# Patient Record
Sex: Female | Born: 1940 | Race: Black or African American | Hispanic: No | Smoking: Never smoker
Health system: Southern US, Community
[De-identification: ages and names within clinical notes are randomized; demographics above are authoritative.]

## PROBLEM LIST (undated history)

## (undated) DIAGNOSIS — C50919 Malignant neoplasm of unspecified site of unspecified female breast: Secondary | ICD-10-CM

## (undated) DIAGNOSIS — I4892 Unspecified atrial flutter: Secondary | ICD-10-CM

## (undated) DIAGNOSIS — I10 Essential (primary) hypertension: Secondary | ICD-10-CM

## (undated) DIAGNOSIS — F329 Major depressive disorder, single episode, unspecified: Secondary | ICD-10-CM

## (undated) DIAGNOSIS — Z950 Presence of cardiac pacemaker: Secondary | ICD-10-CM

## (undated) DIAGNOSIS — F32A Depression, unspecified: Secondary | ICD-10-CM

## (undated) DIAGNOSIS — I4891 Unspecified atrial fibrillation: Secondary | ICD-10-CM

## (undated) DIAGNOSIS — E119 Type 2 diabetes mellitus without complications: Secondary | ICD-10-CM

## (undated) DIAGNOSIS — N3281 Overactive bladder: Secondary | ICD-10-CM

## (undated) DIAGNOSIS — I509 Heart failure, unspecified: Secondary | ICD-10-CM

## (undated) DIAGNOSIS — F419 Anxiety disorder, unspecified: Secondary | ICD-10-CM

## (undated) DIAGNOSIS — N19 Unspecified kidney failure: Secondary | ICD-10-CM

## (undated) DIAGNOSIS — C50912 Malignant neoplasm of unspecified site of left female breast: Secondary | ICD-10-CM

## (undated) HISTORY — DX: Overactive bladder: N32.81

## (undated) HISTORY — DX: Malignant neoplasm of unspecified site of left female breast: C50.912

## (undated) HISTORY — DX: Unspecified atrial flutter: I48.92

## (undated) HISTORY — PX: DILATION AND CURETTAGE OF UTERUS: SHX78

## (undated) HISTORY — DX: Unspecified kidney failure: N19

## (undated) HISTORY — DX: Unspecified atrial fibrillation: I48.91

## (undated) HISTORY — DX: Major depressive disorder, single episode, unspecified: F32.9

## (undated) HISTORY — DX: Malignant neoplasm of unspecified site of unspecified female breast: C50.919

## (undated) HISTORY — DX: Heart failure, unspecified: I50.9

## (undated) HISTORY — DX: Type 2 diabetes mellitus without complications: E11.9

## (undated) HISTORY — DX: Essential (primary) hypertension: I10

## (undated) HISTORY — PX: LAPAROSCOPIC OOPHERECTOMY: SHX6507

## (undated) HISTORY — DX: Depression, unspecified: F32.A

## (undated) HISTORY — DX: Presence of cardiac pacemaker: Z95.0

## (undated) HISTORY — DX: Anxiety disorder, unspecified: F41.9

## (undated) HISTORY — PX: HERNIA REPAIR: SHX51

---

## 1997-03-25 HISTORY — PX: MASTECTOMY: SHX3

## 2004-03-30 ENCOUNTER — Ambulatory Visit: Payer: Self-pay | Admitting: Internal Medicine

## 2004-04-20 ENCOUNTER — Ambulatory Visit: Payer: Self-pay | Admitting: Oncology

## 2004-09-18 ENCOUNTER — Other Ambulatory Visit: Payer: Self-pay

## 2004-09-18 ENCOUNTER — Emergency Department: Payer: Self-pay | Admitting: General Practice

## 2004-10-02 ENCOUNTER — Emergency Department: Payer: Self-pay | Admitting: General Practice

## 2004-10-19 ENCOUNTER — Ambulatory Visit: Payer: Self-pay | Admitting: Oncology

## 2004-10-23 ENCOUNTER — Ambulatory Visit: Payer: Self-pay | Admitting: Oncology

## 2004-11-23 ENCOUNTER — Ambulatory Visit: Payer: Self-pay | Admitting: Oncology

## 2005-10-21 ENCOUNTER — Ambulatory Visit: Payer: Self-pay | Admitting: Oncology

## 2005-10-23 ENCOUNTER — Ambulatory Visit: Payer: Self-pay | Admitting: Oncology

## 2005-11-23 ENCOUNTER — Ambulatory Visit: Payer: Self-pay | Admitting: Oncology

## 2005-12-18 ENCOUNTER — Other Ambulatory Visit: Payer: Self-pay

## 2005-12-18 ENCOUNTER — Ambulatory Visit: Payer: Self-pay | Admitting: Surgery

## 2005-12-23 ENCOUNTER — Ambulatory Visit: Payer: Self-pay | Admitting: Surgery

## 2006-01-03 ENCOUNTER — Ambulatory Visit: Payer: Self-pay | Admitting: Oncology

## 2006-01-10 ENCOUNTER — Ambulatory Visit: Payer: Self-pay | Admitting: Oncology

## 2006-01-23 ENCOUNTER — Ambulatory Visit: Payer: Self-pay | Admitting: Oncology

## 2006-02-22 ENCOUNTER — Ambulatory Visit: Payer: Self-pay | Admitting: Oncology

## 2006-03-25 ENCOUNTER — Ambulatory Visit: Payer: Self-pay | Admitting: Oncology

## 2006-04-25 ENCOUNTER — Ambulatory Visit: Payer: Self-pay | Admitting: Oncology

## 2006-05-28 ENCOUNTER — Ambulatory Visit: Payer: Self-pay | Admitting: Oncology

## 2006-06-11 ENCOUNTER — Ambulatory Visit: Payer: Self-pay | Admitting: Oncology

## 2006-06-19 ENCOUNTER — Other Ambulatory Visit: Payer: Self-pay

## 2006-06-19 ENCOUNTER — Ambulatory Visit: Payer: Self-pay | Admitting: Surgery

## 2006-06-24 HISTORY — PX: MASTECTOMY: SHX3

## 2006-06-30 ENCOUNTER — Inpatient Hospital Stay: Payer: Self-pay | Admitting: Surgery

## 2006-07-02 ENCOUNTER — Other Ambulatory Visit: Payer: Self-pay

## 2006-07-02 ENCOUNTER — Inpatient Hospital Stay: Payer: Self-pay | Admitting: Surgery

## 2006-07-14 ENCOUNTER — Ambulatory Visit: Payer: Self-pay | Admitting: Oncology

## 2006-07-15 ENCOUNTER — Ambulatory Visit: Payer: Self-pay | Admitting: Oncology

## 2006-07-24 ENCOUNTER — Ambulatory Visit: Payer: Self-pay | Admitting: Oncology

## 2006-08-24 ENCOUNTER — Ambulatory Visit: Payer: Self-pay | Admitting: Oncology

## 2006-08-27 ENCOUNTER — Ambulatory Visit: Payer: Self-pay | Admitting: Oncology

## 2006-09-23 ENCOUNTER — Ambulatory Visit: Payer: Self-pay | Admitting: Oncology

## 2006-11-08 ENCOUNTER — Inpatient Hospital Stay: Payer: Self-pay | Admitting: Internal Medicine

## 2006-11-08 ENCOUNTER — Other Ambulatory Visit: Payer: Self-pay

## 2006-11-24 ENCOUNTER — Ambulatory Visit: Payer: Self-pay | Admitting: Oncology

## 2006-11-26 ENCOUNTER — Ambulatory Visit: Payer: Self-pay | Admitting: Oncology

## 2006-12-01 ENCOUNTER — Ambulatory Visit: Payer: Self-pay | Admitting: Family

## 2006-12-08 ENCOUNTER — Ambulatory Visit: Payer: Self-pay | Admitting: Family

## 2006-12-15 DIAGNOSIS — I482 Chronic atrial fibrillation, unspecified: Secondary | ICD-10-CM | POA: Insufficient documentation

## 2006-12-16 ENCOUNTER — Other Ambulatory Visit: Payer: Self-pay

## 2006-12-16 ENCOUNTER — Inpatient Hospital Stay: Payer: Self-pay | Admitting: Internal Medicine

## 2006-12-16 ENCOUNTER — Ambulatory Visit: Payer: Self-pay | Admitting: Internal Medicine

## 2006-12-19 ENCOUNTER — Other Ambulatory Visit: Payer: Self-pay

## 2006-12-19 ENCOUNTER — Inpatient Hospital Stay: Payer: Self-pay | Admitting: Internal Medicine

## 2006-12-24 ENCOUNTER — Ambulatory Visit: Payer: Self-pay | Admitting: Oncology

## 2006-12-24 ENCOUNTER — Other Ambulatory Visit: Payer: Self-pay

## 2007-01-13 ENCOUNTER — Other Ambulatory Visit: Payer: Self-pay

## 2007-01-13 ENCOUNTER — Inpatient Hospital Stay: Payer: Self-pay | Admitting: Internal Medicine

## 2007-02-23 ENCOUNTER — Ambulatory Visit: Payer: Self-pay | Admitting: Oncology

## 2007-03-25 ENCOUNTER — Ambulatory Visit: Payer: Self-pay | Admitting: Oncology

## 2007-03-26 ENCOUNTER — Ambulatory Visit: Payer: Self-pay | Admitting: Oncology

## 2007-05-09 ENCOUNTER — Inpatient Hospital Stay: Payer: Self-pay | Admitting: Internal Medicine

## 2007-05-09 ENCOUNTER — Other Ambulatory Visit: Payer: Self-pay

## 2007-05-24 ENCOUNTER — Ambulatory Visit: Payer: Self-pay | Admitting: Oncology

## 2007-06-24 ENCOUNTER — Ambulatory Visit: Payer: Self-pay | Admitting: Oncology

## 2007-06-24 DIAGNOSIS — Z950 Presence of cardiac pacemaker: Secondary | ICD-10-CM

## 2007-06-24 HISTORY — DX: Presence of cardiac pacemaker: Z95.0

## 2007-09-09 ENCOUNTER — Ambulatory Visit: Payer: Self-pay | Admitting: Oncology

## 2007-09-23 ENCOUNTER — Ambulatory Visit: Payer: Self-pay | Admitting: Oncology

## 2007-10-24 ENCOUNTER — Ambulatory Visit: Payer: Self-pay | Admitting: Oncology

## 2007-11-24 ENCOUNTER — Ambulatory Visit: Payer: Self-pay | Admitting: Oncology

## 2007-12-09 ENCOUNTER — Ambulatory Visit: Payer: Self-pay | Admitting: Oncology

## 2007-12-24 ENCOUNTER — Ambulatory Visit: Payer: Self-pay | Admitting: Oncology

## 2008-03-25 ENCOUNTER — Ambulatory Visit: Payer: Self-pay | Admitting: Oncology

## 2008-04-11 ENCOUNTER — Ambulatory Visit: Payer: Self-pay | Admitting: Oncology

## 2008-04-25 ENCOUNTER — Ambulatory Visit: Payer: Self-pay | Admitting: Oncology

## 2008-06-22 ENCOUNTER — Ambulatory Visit: Payer: Self-pay | Admitting: Unknown Physician Specialty

## 2008-06-30 ENCOUNTER — Ambulatory Visit: Payer: Self-pay | Admitting: Unknown Physician Specialty

## 2008-07-23 ENCOUNTER — Ambulatory Visit: Payer: Self-pay | Admitting: Oncology

## 2008-08-19 ENCOUNTER — Ambulatory Visit: Payer: Self-pay | Admitting: Unknown Physician Specialty

## 2008-08-19 ENCOUNTER — Ambulatory Visit: Payer: Self-pay | Admitting: Oncology

## 2008-08-23 ENCOUNTER — Ambulatory Visit: Payer: Self-pay | Admitting: Oncology

## 2009-01-23 ENCOUNTER — Ambulatory Visit: Payer: Self-pay | Admitting: Oncology

## 2009-02-20 ENCOUNTER — Ambulatory Visit: Payer: Self-pay | Admitting: Oncology

## 2009-02-22 ENCOUNTER — Ambulatory Visit: Payer: Self-pay | Admitting: Oncology

## 2009-08-23 ENCOUNTER — Ambulatory Visit: Payer: Self-pay | Admitting: Oncology

## 2009-09-22 ENCOUNTER — Ambulatory Visit: Payer: Self-pay | Admitting: Oncology

## 2010-02-22 ENCOUNTER — Ambulatory Visit: Payer: Self-pay | Admitting: Oncology

## 2010-03-25 ENCOUNTER — Ambulatory Visit: Payer: Self-pay | Admitting: Oncology

## 2010-08-24 ENCOUNTER — Ambulatory Visit: Payer: Self-pay | Admitting: Oncology

## 2010-09-23 ENCOUNTER — Ambulatory Visit: Payer: Self-pay | Admitting: Oncology

## 2010-12-15 DIAGNOSIS — I38 Endocarditis, valve unspecified: Secondary | ICD-10-CM | POA: Insufficient documentation

## 2010-12-15 DIAGNOSIS — I10 Essential (primary) hypertension: Secondary | ICD-10-CM | POA: Insufficient documentation

## 2011-02-12 DIAGNOSIS — I5022 Chronic systolic (congestive) heart failure: Secondary | ICD-10-CM | POA: Insufficient documentation

## 2011-02-25 ENCOUNTER — Ambulatory Visit: Payer: Self-pay | Admitting: Oncology

## 2011-02-26 LAB — CANCER ANTIGEN 27.29: CA 27.29: 37.5 U/mL (ref 0.0–38.6)

## 2011-03-26 ENCOUNTER — Ambulatory Visit: Payer: Self-pay | Admitting: Oncology

## 2011-08-26 ENCOUNTER — Ambulatory Visit: Payer: Self-pay | Admitting: Oncology

## 2011-08-26 LAB — CBC CANCER CENTER
Basophil #: 0 x10 3/mm (ref 0.0–0.1)
HCT: 35.3 % (ref 35.0–47.0)
HGB: 11.5 g/dL — ABNORMAL LOW (ref 12.0–16.0)
Lymphocyte #: 1 x10 3/mm (ref 1.0–3.6)
Lymphocyte %: 23.8 %
MCV: 90 fL (ref 80–100)
Monocyte #: 0.5 x10 3/mm (ref 0.2–0.9)
Monocyte %: 11 %
Neutrophil #: 2.7 x10 3/mm (ref 1.4–6.5)
Platelet: 181 x10 3/mm (ref 150–440)
RBC: 3.94 10*6/uL (ref 3.80–5.20)
RDW: 13.6 % (ref 11.5–14.5)
WBC: 4.4 x10 3/mm (ref 3.6–11.0)

## 2011-08-26 LAB — COMPREHENSIVE METABOLIC PANEL
Albumin: 4 g/dL (ref 3.4–5.0)
Alkaline Phosphatase: 103 U/L (ref 50–136)
Anion Gap: 9 (ref 7–16)
BUN: 42 mg/dL — ABNORMAL HIGH (ref 7–18)
Co2: 31 mmol/L (ref 21–32)
Creatinine: 1.65 mg/dL — ABNORMAL HIGH (ref 0.60–1.30)
Osmolality: 297 (ref 275–301)
Potassium: 3.6 mmol/L (ref 3.5–5.1)
SGOT(AST): 28 U/L (ref 15–37)
SGPT (ALT): 23 U/L

## 2011-09-23 ENCOUNTER — Ambulatory Visit: Payer: Self-pay | Admitting: Oncology

## 2011-10-01 DIAGNOSIS — Z9889 Other specified postprocedural states: Secondary | ICD-10-CM | POA: Insufficient documentation

## 2012-02-24 ENCOUNTER — Ambulatory Visit: Payer: Self-pay | Admitting: Oncology

## 2012-02-24 LAB — COMPREHENSIVE METABOLIC PANEL
Albumin: 3.9 g/dL (ref 3.4–5.0)
Anion Gap: 11 (ref 7–16)
BUN: 46 mg/dL — ABNORMAL HIGH (ref 7–18)
Bilirubin,Total: 0.6 mg/dL (ref 0.2–1.0)
Chloride: 102 mmol/L (ref 98–107)
Co2: 30 mmol/L (ref 21–32)
Creatinine: 1.91 mg/dL — ABNORMAL HIGH (ref 0.60–1.30)
EGFR (African American): 30 — ABNORMAL LOW
EGFR (Non-African Amer.): 26 — ABNORMAL LOW
Osmolality: 298 (ref 275–301)
Potassium: 4.5 mmol/L (ref 3.5–5.1)
SGOT(AST): 26 U/L (ref 15–37)
SGPT (ALT): 24 U/L (ref 12–78)
Sodium: 143 mmol/L (ref 136–145)
Total Protein: 8.1 g/dL (ref 6.4–8.2)

## 2012-02-24 LAB — CBC CANCER CENTER
Basophil #: 0 x10 3/mm (ref 0.0–0.1)
Eosinophil %: 3.3 %
HCT: 35.6 % (ref 35.0–47.0)
HGB: 11.9 g/dL — ABNORMAL LOW (ref 12.0–16.0)
Lymphocyte #: 1 x10 3/mm (ref 1.0–3.6)
Lymphocyte %: 24 %
MCH: 29.5 pg (ref 26.0–34.0)
Monocyte %: 10.7 %
WBC: 4.1 x10 3/mm (ref 3.6–11.0)

## 2012-03-25 ENCOUNTER — Ambulatory Visit: Payer: Self-pay | Admitting: Oncology

## 2012-06-11 DIAGNOSIS — F209 Schizophrenia, unspecified: Secondary | ICD-10-CM | POA: Insufficient documentation

## 2012-09-01 ENCOUNTER — Ambulatory Visit: Payer: Self-pay | Admitting: Oncology

## 2012-09-03 LAB — COMPREHENSIVE METABOLIC PANEL
Anion Gap: 6 — ABNORMAL LOW (ref 7–16)
BUN: 40 mg/dL — ABNORMAL HIGH (ref 7–18)
Bilirubin,Total: 0.6 mg/dL (ref 0.2–1.0)
EGFR (Non-African Amer.): 31 — ABNORMAL LOW
Glucose: 147 mg/dL — ABNORMAL HIGH (ref 65–99)
Osmolality: 294 (ref 275–301)
Sodium: 141 mmol/L (ref 136–145)
Total Protein: 7.8 g/dL (ref 6.4–8.2)

## 2012-09-03 LAB — CBC CANCER CENTER
Basophil #: 0 x10 3/mm (ref 0.0–0.1)
Basophil %: 0.7 %
Eosinophil #: 0.1 x10 3/mm (ref 0.0–0.7)
Eosinophil %: 2.2 %
HCT: 34.1 % — ABNORMAL LOW (ref 35.0–47.0)
Lymphocyte %: 20.6 %
MCHC: 34.5 g/dL (ref 32.0–36.0)
Monocyte #: 0.7 x10 3/mm (ref 0.2–0.9)
Monocyte %: 11.1 %
Neutrophil #: 4 x10 3/mm (ref 1.4–6.5)
Neutrophil %: 65.4 %
Platelet: 192 x10 3/mm (ref 150–440)
RBC: 3.8 10*6/uL (ref 3.80–5.20)
RDW: 14.1 % (ref 11.5–14.5)
WBC: 6.2 x10 3/mm (ref 3.6–11.0)

## 2012-09-04 LAB — CANCER ANTIGEN 27.29: CA 27.29: 24 U/mL (ref 0.0–38.6)

## 2012-09-22 ENCOUNTER — Ambulatory Visit: Payer: Self-pay | Admitting: Oncology

## 2012-10-21 ENCOUNTER — Ambulatory Visit: Payer: Self-pay | Admitting: Ophthalmology

## 2012-11-18 ENCOUNTER — Ambulatory Visit: Payer: Self-pay | Admitting: Ophthalmology

## 2013-02-15 DIAGNOSIS — M109 Gout, unspecified: Secondary | ICD-10-CM | POA: Insufficient documentation

## 2013-03-16 ENCOUNTER — Ambulatory Visit: Payer: Self-pay | Admitting: Oncology

## 2013-03-16 LAB — CBC CANCER CENTER
Basophil %: 0.9 %
Eosinophil #: 0.2 x10 3/mm (ref 0.0–0.7)
HGB: 11.2 g/dL — ABNORMAL LOW (ref 12.0–16.0)
Lymphocyte #: 1.1 x10 3/mm (ref 1.0–3.6)
Lymphocyte %: 22.5 %
MCH: 29 pg (ref 26.0–34.0)
Neutrophil #: 3.2 x10 3/mm (ref 1.4–6.5)
Neutrophil %: 62.7 %
Platelet: 194 x10 3/mm (ref 150–440)
RBC: 3.88 10*6/uL (ref 3.80–5.20)
WBC: 5.1 x10 3/mm (ref 3.6–11.0)

## 2013-03-16 LAB — COMPREHENSIVE METABOLIC PANEL
Alkaline Phosphatase: 98 U/L
Anion Gap: 10 (ref 7–16)
Chloride: 103 mmol/L (ref 98–107)
Creatinine: 1.9 mg/dL — ABNORMAL HIGH (ref 0.60–1.30)
Glucose: 176 mg/dL — ABNORMAL HIGH (ref 65–99)
SGOT(AST): 17 U/L (ref 15–37)
SGPT (ALT): 23 U/L (ref 12–78)
Sodium: 141 mmol/L (ref 136–145)
Total Protein: 7.6 g/dL (ref 6.4–8.2)

## 2013-03-25 ENCOUNTER — Ambulatory Visit: Payer: Self-pay | Admitting: Oncology

## 2013-05-15 ENCOUNTER — Emergency Department: Payer: Self-pay | Admitting: Emergency Medicine

## 2013-05-15 LAB — BASIC METABOLIC PANEL
Anion Gap: 3 — ABNORMAL LOW (ref 7–16)
BUN: 41 mg/dL — AB (ref 7–18)
CHLORIDE: 105 mmol/L (ref 98–107)
CO2: 31 mmol/L (ref 21–32)
Calcium, Total: 9.7 mg/dL (ref 8.5–10.1)
Creatinine: 1.8 mg/dL — ABNORMAL HIGH (ref 0.60–1.30)
GFR CALC AF AMER: 32 — AB
GFR CALC NON AF AMER: 27 — AB
GLUCOSE: 130 mg/dL — AB (ref 65–99)
OSMOLALITY: 289 (ref 275–301)
POTASSIUM: 4 mmol/L (ref 3.5–5.1)
Sodium: 139 mmol/L (ref 136–145)

## 2013-05-15 LAB — PROTIME-INR
INR: 2.1
Prothrombin Time: 22.7 secs — ABNORMAL HIGH (ref 11.5–14.7)

## 2013-05-15 LAB — TROPONIN I: TROPONIN-I: 0.04 ng/mL

## 2013-05-15 LAB — CBC
HCT: 37.6 % (ref 35.0–47.0)
HGB: 12.1 g/dL (ref 12.0–16.0)
MCH: 28.8 pg (ref 26.0–34.0)
MCHC: 32.1 g/dL (ref 32.0–36.0)
MCV: 90 fL (ref 80–100)
PLATELETS: 191 10*3/uL (ref 150–440)
RBC: 4.2 10*6/uL (ref 3.80–5.20)
RDW: 14.8 % — ABNORMAL HIGH (ref 11.5–14.5)
WBC: 5.5 10*3/uL (ref 3.6–11.0)

## 2013-12-04 ENCOUNTER — Ambulatory Visit: Payer: Self-pay | Admitting: Physician Assistant

## 2013-12-08 DIAGNOSIS — D649 Anemia, unspecified: Secondary | ICD-10-CM | POA: Insufficient documentation

## 2013-12-08 DIAGNOSIS — R809 Proteinuria, unspecified: Secondary | ICD-10-CM

## 2013-12-08 DIAGNOSIS — E7800 Pure hypercholesterolemia, unspecified: Secondary | ICD-10-CM | POA: Insufficient documentation

## 2013-12-08 DIAGNOSIS — E1129 Type 2 diabetes mellitus with other diabetic kidney complication: Secondary | ICD-10-CM | POA: Insufficient documentation

## 2013-12-08 DIAGNOSIS — E1140 Type 2 diabetes mellitus with diabetic neuropathy, unspecified: Secondary | ICD-10-CM | POA: Insufficient documentation

## 2013-12-08 DIAGNOSIS — H409 Unspecified glaucoma: Secondary | ICD-10-CM | POA: Insufficient documentation

## 2013-12-08 DIAGNOSIS — N183 Chronic kidney disease, stage 3 unspecified: Secondary | ICD-10-CM | POA: Insufficient documentation

## 2013-12-08 DIAGNOSIS — I429 Cardiomyopathy, unspecified: Secondary | ICD-10-CM | POA: Insufficient documentation

## 2014-02-28 ENCOUNTER — Ambulatory Visit: Payer: Self-pay | Admitting: Oncology

## 2014-02-28 LAB — COMPREHENSIVE METABOLIC PANEL
ALT: 20 U/L
ANION GAP: 9 (ref 7–16)
Albumin: 3.7 g/dL (ref 3.4–5.0)
Alkaline Phosphatase: 110 U/L
BUN: 57 mg/dL — ABNORMAL HIGH (ref 7–18)
Bilirubin,Total: 0.5 mg/dL (ref 0.2–1.0)
CALCIUM: 9.7 mg/dL (ref 8.5–10.1)
CREATININE: 2.13 mg/dL — AB (ref 0.60–1.30)
Chloride: 104 mmol/L (ref 98–107)
Co2: 28 mmol/L (ref 21–32)
EGFR (African American): 29 — ABNORMAL LOW
GFR CALC NON AF AMER: 24 — AB
Glucose: 133 mg/dL — ABNORMAL HIGH (ref 65–99)
Osmolality: 299 (ref 275–301)
POTASSIUM: 4.4 mmol/L (ref 3.5–5.1)
SGOT(AST): 17 U/L (ref 15–37)
SODIUM: 141 mmol/L (ref 136–145)
Total Protein: 7.8 g/dL (ref 6.4–8.2)

## 2014-02-28 LAB — CBC CANCER CENTER
BASOS PCT: 1.1 %
Basophil #: 0.1 x10 3/mm (ref 0.0–0.1)
EOS PCT: 2.9 %
Eosinophil #: 0.1 x10 3/mm (ref 0.0–0.7)
HCT: 34.9 % — ABNORMAL LOW (ref 35.0–47.0)
HGB: 11.5 g/dL — ABNORMAL LOW (ref 12.0–16.0)
Lymphocyte #: 1.4 x10 3/mm (ref 1.0–3.6)
Lymphocyte %: 27 %
MCH: 29.6 pg (ref 26.0–34.0)
MCHC: 32.8 g/dL (ref 32.0–36.0)
MCV: 90 fL (ref 80–100)
MONO ABS: 0.6 x10 3/mm (ref 0.2–0.9)
Monocyte %: 11 %
Neutrophil #: 3 x10 3/mm (ref 1.4–6.5)
Neutrophil %: 58 %
Platelet: 199 x10 3/mm (ref 150–440)
RBC: 3.88 10*6/uL (ref 3.80–5.20)
RDW: 14.3 % (ref 11.5–14.5)
WBC: 5.2 x10 3/mm (ref 3.6–11.0)

## 2014-03-25 ENCOUNTER — Ambulatory Visit: Payer: Self-pay | Admitting: Oncology

## 2014-06-08 DIAGNOSIS — R32 Unspecified urinary incontinence: Secondary | ICD-10-CM | POA: Insufficient documentation

## 2014-06-08 DIAGNOSIS — Z7901 Long term (current) use of anticoagulants: Secondary | ICD-10-CM | POA: Insufficient documentation

## 2014-12-09 DIAGNOSIS — N2581 Secondary hyperparathyroidism of renal origin: Secondary | ICD-10-CM | POA: Insufficient documentation

## 2015-03-03 ENCOUNTER — Encounter: Payer: Self-pay | Admitting: Family Medicine

## 2015-03-03 ENCOUNTER — Inpatient Hospital Stay (HOSPITAL_BASED_OUTPATIENT_CLINIC_OR_DEPARTMENT_OTHER): Payer: Medicare Other | Admitting: Family Medicine

## 2015-03-03 ENCOUNTER — Other Ambulatory Visit: Payer: Self-pay | Admitting: *Deleted

## 2015-03-03 ENCOUNTER — Inpatient Hospital Stay: Payer: Medicare Other | Attending: Family Medicine

## 2015-03-03 VITALS — BP 145/81 | HR 91 | Temp 96.0°F | Resp 18 | Ht 66.0 in | Wt 172.0 lb

## 2015-03-03 DIAGNOSIS — I509 Heart failure, unspecified: Secondary | ICD-10-CM

## 2015-03-03 DIAGNOSIS — I10 Essential (primary) hypertension: Secondary | ICD-10-CM | POA: Diagnosis not present

## 2015-03-03 DIAGNOSIS — C50919 Malignant neoplasm of unspecified site of unspecified female breast: Secondary | ICD-10-CM

## 2015-03-03 DIAGNOSIS — Z9013 Acquired absence of bilateral breasts and nipples: Secondary | ICD-10-CM | POA: Diagnosis not present

## 2015-03-03 DIAGNOSIS — Z950 Presence of cardiac pacemaker: Secondary | ICD-10-CM | POA: Diagnosis not present

## 2015-03-03 DIAGNOSIS — Z794 Long term (current) use of insulin: Secondary | ICD-10-CM | POA: Diagnosis not present

## 2015-03-03 DIAGNOSIS — Z853 Personal history of malignant neoplasm of breast: Secondary | ICD-10-CM

## 2015-03-03 DIAGNOSIS — Z9223 Personal history of estrogen therapy: Secondary | ICD-10-CM

## 2015-03-03 DIAGNOSIS — Z7901 Long term (current) use of anticoagulants: Secondary | ICD-10-CM | POA: Diagnosis not present

## 2015-03-03 DIAGNOSIS — Z79899 Other long term (current) drug therapy: Secondary | ICD-10-CM | POA: Diagnosis not present

## 2015-03-03 DIAGNOSIS — C50912 Malignant neoplasm of unspecified site of left female breast: Secondary | ICD-10-CM | POA: Insufficient documentation

## 2015-03-03 DIAGNOSIS — E119 Type 2 diabetes mellitus without complications: Secondary | ICD-10-CM | POA: Insufficient documentation

## 2015-03-03 HISTORY — DX: Malignant neoplasm of unspecified site of left female breast: C50.912

## 2015-03-03 LAB — CBC WITH DIFFERENTIAL/PLATELET
Basophils Absolute: 0 10*3/uL (ref 0–0.1)
Basophils Relative: 1 %
EOS ABS: 0.1 10*3/uL (ref 0–0.7)
EOS PCT: 3 %
HCT: 37 % (ref 35.0–47.0)
Hemoglobin: 12.2 g/dL (ref 12.0–16.0)
LYMPHS ABS: 1.3 10*3/uL (ref 1.0–3.6)
Lymphocytes Relative: 28 %
MCH: 29.7 pg (ref 26.0–34.0)
MCHC: 33 g/dL (ref 32.0–36.0)
MCV: 89.9 fL (ref 80.0–100.0)
MONOS PCT: 13 %
Monocytes Absolute: 0.6 10*3/uL (ref 0.2–0.9)
Neutro Abs: 2.6 10*3/uL (ref 1.4–6.5)
Neutrophils Relative %: 55 %
PLATELETS: 183 10*3/uL (ref 150–440)
RBC: 4.12 MIL/uL (ref 3.80–5.20)
RDW: 14.5 % (ref 11.5–14.5)
WBC: 4.6 10*3/uL (ref 3.6–11.0)

## 2015-03-03 LAB — COMPREHENSIVE METABOLIC PANEL
ALT: 19 U/L (ref 14–54)
ANION GAP: 8 (ref 5–15)
AST: 23 U/L (ref 15–41)
Albumin: 4.1 g/dL (ref 3.5–5.0)
Alkaline Phosphatase: 92 U/L (ref 38–126)
BUN: 55 mg/dL — ABNORMAL HIGH (ref 6–20)
CHLORIDE: 102 mmol/L (ref 101–111)
CO2: 30 mmol/L (ref 22–32)
Calcium: 9.6 mg/dL (ref 8.9–10.3)
Creatinine, Ser: 1.58 mg/dL — ABNORMAL HIGH (ref 0.44–1.00)
GFR calc non Af Amer: 31 mL/min — ABNORMAL LOW (ref >60)
GFR, EST AFRICAN AMERICAN: 36 mL/min — AB (ref >60)
Glucose, Bld: 149 mg/dL — ABNORMAL HIGH (ref 65–99)
Potassium: 4.3 mmol/L (ref 3.5–5.1)
SODIUM: 140 mmol/L (ref 135–145)
Total Bilirubin: 0.4 mg/dL (ref 0.3–1.2)
Total Protein: 7.6 g/dL (ref 6.5–8.1)

## 2015-03-03 NOTE — Progress Notes (Signed)
[place]  [phone]  [phone]     Stephanie Bailey DOB: 11-20-1940  MR#: [id number]  KHT#:[id number]  Patient Care Team: Ezequiel Kayser, MD as PCP - General (Internal Medicine)  CHIEF COMPLAINT:  Chief Complaint  Patient presents with  . 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 The patient's first mastectomy on the right side was in 1999, second primary left breast cancer diagnosed in October 2007 with subsequent left mastectomy in April 2008. Patient reports overall feeling very well. She was last seen in this office approximately 1 year ago by Dr. Oliva Bustard. At that time Arimidex was discontinued. Patient continues with abdominal distention that has been investigated thoroughly with no significant findings.  REVIEW OF SYSTEMS:   Review of Systems  Constitutional: Negative for fever, chills, weight loss, malaise/fatigue and diaphoresis.  HENT: Negative for congestion and sore throat.   Eyes: Negative for blurred vision, double vision, photophobia, pain, discharge and redness.  Respiratory: Negative for cough, hemoptysis, sputum production, shortness of breath and wheezing.   Cardiovascular: Negative for chest pain, palpitations, orthopnea, claudication, leg swelling and PND.  Gastrointestinal: Negative for heartburn, nausea, vomiting, abdominal pain, diarrhea, constipation, blood in stool and melena.  Genitourinary: Negative.   Musculoskeletal: Negative.   Skin: Negative.   Neurological: Negative for dizziness, tingling, focal weakness, seizures, weakness and headaches.  Endo/Heme/Allergies: Does not bruise/bleed easily.  Psychiatric/Behavioral: Negative for depression. The patient is not nervous/anxious and does not have insomnia.     As per HPI. Otherwise, a complete review of systems is negatve.  ONCOLOGY HISTORY:   Breast cancer, left breast  ([place])   03/03/2015 Initial Diagnosis Breast cancer, left breast ([place])    PAST MEDICAL HISTORY: Past Medical History  Diagnosis Date  . Breast cancer ([place])   . Kidney failure   . Atrial fibrillation ([place])   . Atrial flutter ([place])   . CHF (congestive heart failure) ([place])   . Overactive bladder   . HTN (hypertension)   . Diabetes ([place])   . Pacemaker april 2009  . Depression   . Breast cancer, left breast ([place]) 03/03/2015    PAST SURGICAL HISTORY: Past Surgical History  Procedure Laterality Date  . Laparoscopic oopherectomy    . Mastectomy Left april 2008  . Mastectomy Right 1999    FAMILY HISTORY No family history on file.  GYNECOLOGIC HISTORY:  No LMP recorded.     ADVANCED DIRECTIVES:    HEALTH MAINTENANCE: Social History  Substance Use Topics  . Smoking status: Never Smoker   . Smokeless tobacco: Never Used  . Alcohol Use: No     Colonoscopy:  PAP:  Bone density:  Lipid panel:  Allergies  Allergen Reactions  . Heparin     Current Outpatient Prescriptions  Medication Sig Dispense Refill  . allopurinol (ZYLOPRIM) 300 MG tablet Take 300 mg by mouth daily.    Marland Kitchen anastrozole (ARIMIDEX) 1 MG tablet Take 1 mg by mouth daily.    Marland Kitchen atorvastatin (LIPITOR) 10 MG tablet Take 10 mg by mouth daily.    . brimonidine (ALPHAGAN) 0.2 % ophthalmic solution Place 1 drop into both eyes 2 (two) times daily.    . colchicine 0.6 MG tablet Take 0.6 mg by mouth daily.    . digoxin (LANOXIN) 0.125 MG tablet Take 0.125 mg by mouth every Monday, Wednesday, and Friday.    . docusate sodium (COLACE) 100 MG capsule  Take 100 mg by mouth 2 (two) times daily. 1 cap in the morning; 2 caps in the evening    . ferrous sulfate 325 (65 FE) MG tablet Take 325 mg by mouth daily with breakfast.    . hydrALAZINE (APRESOLINE) 50 MG tablet Take 50 mg by mouth 2 (two) times daily.    . insulin glargine (LANTUS) 100 UNIT/ML injection Inject 10 Units into the skin at bedtime.    . insulin regular  (NOVOLIN R,HUMULIN R) 100 units/mL injection Inject into the skin 3 (three) times daily before meals. 4 units before breakfast, 2 units before lunch, 4 units before dinner    . isosorbide mononitrate (IMDUR) 30 MG 24 hr tablet Take 30 mg by mouth daily.    Marland Kitchen latanoprost (XALATAN) 0.005 % ophthalmic solution Place 1 drop into both eyes at bedtime.    Marland Kitchen lisinopril (PRINIVIL,ZESTRIL) 40 MG tablet Take 40 mg by mouth daily.    . metoprolol (LOPRESSOR) 100 MG tablet Take 100 mg by mouth 2 (two) times daily. 1 tab in the morning, 0.5 tab in the evening.    . Multiple Vitamin (MULTIVITAMIN) tablet Take 1 tablet by mouth daily.    Marland Kitchen omega-3 acid ethyl esters (LOVAZA) 1 G capsule Take 1 g by mouth daily.    . risperiDONE (RISPERDAL) 1 MG tablet Take 1 mg by mouth daily.    . simvastatin (ZOCOR) 40 MG tablet Take 40 mg by mouth daily at 6 PM.    . spironolactone (ALDACTONE) 25 MG tablet Take 25 mg by mouth daily.    Marland Kitchen torsemide (DEMADEX) 20 MG tablet Take 40 mg by mouth 2 (two) times daily.    Marland Kitchen warfarin (COUMADIN) 5 MG tablet Take 5 mg by mouth daily. 1.5 tabs on Monday; 1 tab every other day of the week.     No current facility-administered medications for this visit.    OBJECTIVE: BP 145/81 mmHg  Pulse 91  Temp(Src) 96 F (35.6 C) (Tympanic)  Resp 18  Ht _0  (1.676 m)  Wt 171 lb 15.3 oz (78 kg)  BMI 27.77 kg/m2   Body mass index is 27.77 kg/(m^2).    ECOG FS:0 - Asymptomatic  General: Well-developed, well-nourished, no acute distress. Eyes: Pink conjunctiva, anicteric sclera. HEENT: Normocephalic, moist mucous membranes, clear oropharnyx. Lungs: Clear to auscultation bilaterally. Heart: Regular rate and rhythm. No rubs, murmurs, or gallops. Abdomen: Soft, nontender, slightly distended. No organomegaly noted, normoactive bowel sounds. Breast: Status post bilateral mastectomies. Chest wall and axilla free of masses.  Musculoskeletal: No edema, cyanosis, or clubbing. Neuro: Alert, answering  all questions appropriately. Cranial nerves grossly intact. Skin: No rashes or petechiae noted. Psych: Normal affect. Lymphatics: No cervical, clavicular, or axillary lymphadenopathy. LAB RESULTS:  Appointment on 03/03/2015  Component Date Value Ref Range Status  . WBC 03/03/2015 4.6  3.6 - 11.0 K/uL Final  . RBC 03/03/2015 4.12  3.80 - 5.20 MIL/uL Final  . Hemoglobin 03/03/2015 12.2  12.0 - 16.0 g/dL Final  . HCT 03/03/2015 37.0  35.0 - 47.0 % Final  . MCV 03/03/2015 89.9  80.0 - 100.0 fL Final  . MCH 03/03/2015 29.7  26.0 - 34.0 pg Final  . MCHC 03/03/2015 33.0  32.0 - 36.0 g/dL Final  . RDW 03/03/2015 14.5  11.5 - 14.5 % Final  . Platelets 03/03/2015 183  150 - 440 K/uL Final  . Neutrophils Relative % 03/03/2015 55   Final  . Neutro Abs 03/03/2015 2.6  1.4 - 6.5  K/uL Final  . Lymphocytes Relative 03/03/2015 28   Final  . Lymphs Abs 03/03/2015 1.3  1.0 - 3.6 K/uL Final  . Monocytes Relative 03/03/2015 13   Final  . Monocytes Absolute 03/03/2015 0.6  0.2 - 0.9 K/uL Final  . Eosinophils Relative 03/03/2015 3   Final  . Eosinophils Absolute 03/03/2015 0.1  0 - 0.7 K/uL Final  . Basophils Relative 03/03/2015 1   Final  . Basophils Absolute 03/03/2015 0.0  0 - 0.1 K/uL Final  . Sodium 03/03/2015 140  135 - 145 mmol/L Final  . Potassium 03/03/2015 4.3  3.5 - 5.1 mmol/L Final  . Chloride 03/03/2015 102  101 - 111 mmol/L Final  . CO2 03/03/2015 30  22 - 32 mmol/L Final  . Glucose, Bld 03/03/2015 149* 65 - 99 mg/dL Final  . BUN 03/03/2015 55* 6 - 20 mg/dL Final  . Creatinine, Ser 03/03/2015 1.58* 0.44 - 1.00 mg/dL Final  . Calcium 03/03/2015 9.6  8.9 - 10.3 mg/dL Final  . Total Protein 03/03/2015 7.6  6.5 - 8.1 g/dL Final  . Albumin 03/03/2015 4.1  3.5 - 5.0 g/dL Final  . AST 03/03/2015 23  15 - 41 U/L Final  . ALT 03/03/2015 19  14 - 54 U/L Final  . Alkaline Phosphatase 03/03/2015 92  38 - 126 U/L Final  . Total Bilirubin 03/03/2015 0.4  0.3 - 1.2 mg/dL Final  . GFR calc non Af  Amer 03/03/2015 31* >60 mL/min Final  . GFR calc Af Amer 03/03/2015 36* >60 mL/min Final   Comment: (NOTE) The eGFR has been calculated using the CKD EPI equation. This calculation has not been validated in all clinical situations. eGFR's persistently <60 mL/min signify possible Chronic Kidney Disease.   . Anion gap 03/03/2015 8  5 - 15 Final    STUDIES: No results found.  ASSESSMENT:  Carcinoma of breast; right side in 1999, left side in 2007.  PLAN:   1. Carcinoma of breast. Patient has undergone diagnosis of 2 separate primary breast cancers and is status post bilateral mastectomy. Clinically there is no evidence of recurrent disease. Patient's Arimidex was discontinued in December 2015 when she followed up with Dr. Oliva Bustard. She had completed 7 years. Patient overall is doing very well. She continues with routine follow-up with primary care provider Dr. Dorthula Perfect.  We'll continue with routine follow-up in approximately one year.  Patient expressed understanding and was in agreement with this plan. She also understands that She can call clinic at any time with any questions, concerns, or complaints.   Dr. Oliva Bustard was available for consultation and review of plan of care for this patient.   Evlyn Kanner, NP   03/03/2015 4:13 PM

## 2015-04-15 ENCOUNTER — Emergency Department: Payer: Medicare Other

## 2015-04-15 ENCOUNTER — Encounter: Payer: Self-pay | Admitting: Emergency Medicine

## 2015-04-15 ENCOUNTER — Emergency Department
Admission: EM | Admit: 2015-04-15 | Discharge: 2015-04-15 | Disposition: A | Discharge status: Home / Self Care | Payer: Medicare Other | Attending: Emergency Medicine | Admitting: Emergency Medicine

## 2015-04-15 DIAGNOSIS — I10 Essential (primary) hypertension: Secondary | ICD-10-CM | POA: Diagnosis not present

## 2015-04-15 DIAGNOSIS — E119 Type 2 diabetes mellitus without complications: Secondary | ICD-10-CM | POA: Diagnosis not present

## 2015-04-15 DIAGNOSIS — Z79899 Other long term (current) drug therapy: Secondary | ICD-10-CM | POA: Insufficient documentation

## 2015-04-15 DIAGNOSIS — Z7901 Long term (current) use of anticoagulants: Secondary | ICD-10-CM | POA: Insufficient documentation

## 2015-04-15 DIAGNOSIS — Z794 Long term (current) use of insulin: Secondary | ICD-10-CM | POA: Insufficient documentation

## 2015-04-15 DIAGNOSIS — K5732 Diverticulitis of large intestine without perforation or abscess without bleeding: Secondary | ICD-10-CM | POA: Insufficient documentation

## 2015-04-15 DIAGNOSIS — R1032 Left lower quadrant pain: Secondary | ICD-10-CM | POA: Diagnosis present

## 2015-04-15 LAB — URINALYSIS COMPLETE WITH MICROSCOPIC (ARMC ONLY)
Bacteria, UA: NONE SEEN
Bilirubin Urine: NEGATIVE
Glucose, UA: NEGATIVE mg/dL
HGB URINE DIPSTICK: NEGATIVE
Ketones, ur: NEGATIVE mg/dL
Leukocytes, UA: NEGATIVE
Nitrite: NEGATIVE
PH: 5 (ref 5.0–8.0)
Protein, ur: NEGATIVE mg/dL
Specific Gravity, Urine: 1.01 (ref 1.005–1.030)
WBC UA: NONE SEEN WBC/hpf (ref 0–5)

## 2015-04-15 LAB — LIPASE, BLOOD: LIPASE: 35 U/L (ref 11–51)

## 2015-04-15 LAB — CBC WITH DIFFERENTIAL/PLATELET
BASOS PCT: 1 %
Basophils Absolute: 0.1 10*3/uL (ref 0–0.1)
EOS ABS: 0 10*3/uL (ref 0–0.7)
EOS PCT: 0 %
HEMATOCRIT: 35.8 % (ref 35.0–47.0)
Hemoglobin: 11.5 g/dL — ABNORMAL LOW (ref 12.0–16.0)
LYMPHS ABS: 1.4 10*3/uL (ref 1.0–3.6)
Lymphocytes Relative: 10 %
MCH: 28.7 pg (ref 26.0–34.0)
MCHC: 32.2 g/dL (ref 32.0–36.0)
MCV: 89.3 fL (ref 80.0–100.0)
Monocytes Absolute: 1.5 10*3/uL — ABNORMAL HIGH (ref 0.2–0.9)
Monocytes Relative: 11 %
Neutro Abs: 10.2 10*3/uL — ABNORMAL HIGH (ref 1.4–6.5)
Neutrophils Relative %: 78 %
Platelets: 195 10*3/uL (ref 150–440)
RBC: 4.01 MIL/uL (ref 3.80–5.20)
RDW: 14.2 % (ref 11.5–14.5)
WBC: 13.2 10*3/uL — AB (ref 3.6–11.0)

## 2015-04-15 LAB — COMPREHENSIVE METABOLIC PANEL
ALT: 13 U/L — ABNORMAL LOW (ref 14–54)
ANION GAP: 8 (ref 5–15)
AST: 19 U/L (ref 15–41)
Albumin: 3.9 g/dL (ref 3.5–5.0)
Alkaline Phosphatase: 70 U/L (ref 38–126)
BILIRUBIN TOTAL: 1.3 mg/dL — AB (ref 0.3–1.2)
BUN: 65 mg/dL — AB (ref 6–20)
CO2: 27 mmol/L (ref 22–32)
Calcium: 9.4 mg/dL (ref 8.9–10.3)
Chloride: 104 mmol/L (ref 101–111)
Creatinine, Ser: 2.07 mg/dL — ABNORMAL HIGH (ref 0.44–1.00)
GFR, EST AFRICAN AMERICAN: 26 mL/min — AB (ref >60)
GFR, EST NON AFRICAN AMERICAN: 22 mL/min — AB (ref >60)
Glucose, Bld: 103 mg/dL — ABNORMAL HIGH (ref 65–99)
POTASSIUM: 4.1 mmol/L (ref 3.5–5.1)
Sodium: 139 mmol/L (ref 135–145)
TOTAL PROTEIN: 7.8 g/dL (ref 6.5–8.1)

## 2015-04-15 MED ORDER — CIPROFLOXACIN HCL 500 MG PO TABS
500.0000 mg | ORAL_TABLET | Freq: Once | ORAL | Status: AC
  Administered 2015-04-15: 500 mg via ORAL
  Filled 2015-04-15: qty 1

## 2015-04-15 MED ORDER — IOHEXOL 240 MG/ML SOLN
25.0000 mL | INTRAMUSCULAR | Status: AC
Start: 2015-04-15 — End: 2015-04-15
  Administered 2015-04-15: 25 mL via ORAL

## 2015-04-15 MED ORDER — TRAMADOL HCL 50 MG PO TABS: 50.0000 mg | ORAL_TABLET | Freq: Four times a day (QID) | ORAL | Status: DC | PRN

## 2015-04-15 MED ORDER — METRONIDAZOLE 500 MG PO TABS: 500.0000 mg | ORAL_TABLET | Freq: Three times a day (TID) | ORAL | Status: AC

## 2015-04-15 MED ORDER — CIPROFLOXACIN HCL 500 MG PO TABS: 500.0000 mg | ORAL_TABLET | Freq: Two times a day (BID) | ORAL | Status: AC

## 2015-04-15 MED ORDER — METRONIDAZOLE 500 MG PO TABS
500.0000 mg | ORAL_TABLET | Freq: Once | ORAL | Status: AC
  Administered 2015-04-15: 500 mg via ORAL
  Filled 2015-04-15: qty 1

## 2015-04-15 NOTE — Discharge Instructions (Signed)
Please follow-up with her primary care physician in 2-3 days for recheck/reevaluation and repeat of your Coumadin level as the antibiotics May affect this level. Return to the emergency department for any increased abdominal pain, fever, or any other symptom personally concerning to yourself. Please take your pain medication as needed, as prescribed.   Diverticulitis Diverticulitis is when small pockets that have formed in your colon (large intestine) become infected or swollen. HOME CARE  Follow your doctor's instructions.  Follow a special diet if told by your doctor.  When you feel better, your doctor may tell you to change your diet. You may be told to eat a lot of fiber. Fruits and vegetables are good sources of fiber. Fiber makes it easier to poop (have bowel movements).  Take supplements or probiotics as told by your doctor.  Only take medicines as told by your doctor.  Keep all follow-up visits with your doctor. GET HELP IF:  Your pain does not get better.  You have a hard time eating food.  You are not pooping like normal. GET HELP RIGHT AWAY IF:  Your pain gets worse.  Your problems do not get better.  Your problems suddenly get worse.  You have a fever.  You keep throwing up (vomiting).  You have bloody or black, tarry poop (stool). MAKE SURE YOU:   Understand these instructions.  Will watch your condition.  Will get help right away if you are not doing well or get worse.   This information is not intended to replace advice given to you by your health care provider. Make sure you discuss any questions you have with your health care provider.   Document Released: 08/28/2007 Document Revised: 03/16/2013 Document Reviewed: 02/03/2013 Elsevier Interactive Patient Education [employer].

## 2015-04-15 NOTE — ED Provider Notes (Signed)
[facility] Emergency Department Provider Note  Time seen: 6:01 PM  I have reviewed the triage vital signs and the nursing notes.   HISTORY  Chief Complaint Abdominal Pain    HPI Stephanie Bailey is a 75 y.o. female with a past medical history of atrial fibrillation on Coumadin, CHF, hypertension, diabetes, presents to the emergency department with abdominal pain. According to the patient for the past 2-3 days she has had mild to moderate left lower quadrant tenderness to palpation. Denies nausea, vomiting, dysuria. She has noted several episodes of loose stool since the pain began. Currently describes her discomfort as very mild, but somewhat increased when she sits up.     Past Medical History  Diagnosis Date  . Breast cancer ([place])   . Kidney failure   . Atrial fibrillation ([place])   . Atrial flutter ([place])   . CHF (congestive heart failure) ([place])   . Overactive bladder   . HTN (hypertension)   . Diabetes ([place])   . Pacemaker april 2009  . Depression   . Breast cancer, left breast ([place]) 03/03/2015    Patient Active Problem List   Diagnosis Date Noted  . Breast cancer, left breast ([place]) 03/03/2015    Past Surgical History  Procedure Laterality Date  . Laparoscopic oopherectomy    . Mastectomy Left april 2008  . Mastectomy Right 1999    Current Outpatient Rx  Name  Route  Sig  Dispense  Refill  . allopurinol (ZYLOPRIM) 300 MG tablet   Oral   Take 300 mg by mouth daily.         Marland Kitchen anastrozole (ARIMIDEX) 1 MG tablet   Oral   Take 1 mg by mouth daily.         Marland Kitchen atorvastatin (LIPITOR) 10 MG tablet   Oral   Take 10 mg by mouth daily.         . brimonidine (ALPHAGAN) 0.2 % ophthalmic solution   Both Eyes   Place 1 drop into both eyes 2 (two) times daily.         . colchicine 0.6 MG tablet   Oral   Take 0.6 mg by mouth daily.         . digoxin (LANOXIN) 0.125 MG tablet   Oral   Take 0.125 mg by mouth every Monday, Wednesday, and  Friday.         . docusate sodium (COLACE) 100 MG capsule   Oral   Take 100 mg by mouth 2 (two) times daily. 1 cap in the morning; 2 caps in the evening         . ferrous sulfate 325 (65 FE) MG tablet   Oral   Take 325 mg by mouth daily with breakfast.         . hydrALAZINE (APRESOLINE) 50 MG tablet   Oral   Take 50 mg by mouth 2 (two) times daily.         . insulin glargine (LANTUS) 100 UNIT/ML injection   Subcutaneous   Inject 10 Units into the skin at bedtime.         . insulin regular (NOVOLIN R,HUMULIN R) 100 units/mL injection   Subcutaneous   Inject into the skin 3 (three) times daily before meals. 4 units before breakfast, 2 units before lunch, 4 units before dinner         . isosorbide mononitrate (IMDUR) 30 MG 24 hr tablet   Oral   Take 30 mg by  mouth daily.         Marland Kitchen latanoprost (XALATAN) 0.005 % ophthalmic solution   Both Eyes   Place 1 drop into both eyes at bedtime.         Marland Kitchen lisinopril (PRINIVIL,ZESTRIL) 40 MG tablet   Oral   Take 40 mg by mouth daily.         . metoprolol (LOPRESSOR) 100 MG tablet   Oral   Take 100 mg by mouth 2 (two) times daily. 1 tab in the morning, 0.5 tab in the evening.         . Multiple Vitamin (MULTIVITAMIN) tablet   Oral   Take 1 tablet by mouth daily.         Marland Kitchen omega-3 acid ethyl esters (LOVAZA) 1 G capsule   Oral   Take 1 g by mouth daily.         . risperiDONE (RISPERDAL) 1 MG tablet   Oral   Take 1 mg by mouth daily.         . simvastatin (ZOCOR) 40 MG tablet   Oral   Take 40 mg by mouth daily at 6 PM.         . spironolactone (ALDACTONE) 25 MG tablet   Oral   Take 25 mg by mouth daily.         Marland Kitchen torsemide (DEMADEX) 20 MG tablet   Oral   Take 40 mg by mouth 2 (two) times daily.         Marland Kitchen warfarin (COUMADIN) 5 MG tablet   Oral   Take 5 mg by mouth daily. 1.5 tabs on Monday; 1 tab every other day of the week.           Allergies Heparin  No family history on  file.  Social History Social History  Substance Use Topics  . Smoking status: Never Smoker   . Smokeless tobacco: Never Used  . Alcohol Use: No    Review of Systems Constitutional: Negative for fever Cardiovascular: Negative for chest pain. Respiratory: Negative for shortness of breath. Gastrointestinal: Negative for abdominal pain Genitourinary: Negative for dysuria. Neurological: Negative for headache 10-point ROS otherwise negative.  ____________________________________________   PHYSICAL EXAM:  VITAL SIGNS: ED Triage Vitals  Enc Vitals Group     BP 04/15/15 1437 144/61 mmHg     Pulse Rate 04/15/15 1437 84     Resp 04/15/15 1437 18     Temp 04/15/15 1437 98.5 F (36.9 C)     Temp Source 04/15/15 1437 Oral     SpO2 04/15/15 1437 95 %     Weight 04/15/15 1437 170 lb (77.111 kg)     Height 04/15/15 1437 5\' 6"  (1.676 m)     Head Cir --      Peak Flow --      Pain Score 04/15/15 1440 5     Pain Loc --      Pain Edu? --      Excl. in [employer]? --     Constitutional: Alert and oriented. Well appearing and in no distress. Eyes: Normal exam ENT   Head: Normocephalic and atraumatic.   Mouth/Throat: Mucous membranes are moist. Cardiovascular: Normal rate, regular rhythm. No murmur Respiratory: Normal respiratory effort without tachypnea nor retractions. Breath sounds are clear and equal bilaterally. No wheezes/rales/rhonchi. Gastrointestinal: Soft, moderate left lower quadrant tenderness to palpation. No rebound or guarding. Mild distention. Tympanic percussion. Musculoskeletal: Nontender with normal range of motion in all extremities. No lower extremity  tenderness or edema. Neurologic:  Normal speech and language. No gross focal neurologic deficits Skin:  Skin is warm, dry and intact.  Psychiatric: Mood and affect are normal. Speech and behavior are normal. ____________________________________________   RADIOLOGY  CT consistent with sigmoid  diverticulitis  ____________________________________________   INITIAL IMPRESSION / ASSESSMENT AND PLAN / ED COURSE  Pertinent labs & imaging results that were available during my care of the patient were reviewed by me and considered in my medical decision making (see chart for details).  Overall very well-appearing patient with mild to moderate left lower quadrant pain for the past 2-3 days. Moderate tenderness palpation on exam. She states several episodes of loose stool, last bowel movement was last night area denies black or blood. Patient's labs show an elevated white blood cell count of 13,000. Given her tenderness to palpation with elevated white blood cell count we'll proceed with a CT abdomen/pelvis without contrast given her renal insufficiency. High suspicion for diverticulitis/colitis.  CT scan consistent with sigmoid diverticulitis, uncomplicated. We will start the patient on Flagyl and ciprofloxacin and discharged with a short course of pain medication. Patient is up with her primary care physician for recheck/reevaluation this week. Patient agreeable to plan. Discussed strict return precautions.  ____________________________________________   FINAL CLINICAL IMPRESSION(S) / ED DIAGNOSES  Left lower quadrant abdominal pain   Harvest Dark, MD 04/15/15 2034

## 2015-04-15 NOTE — ED Notes (Signed)
Lower abdominal pain x2 days.

## 2015-12-12 ENCOUNTER — Encounter: Payer: Self-pay | Admitting: Urology

## 2015-12-12 ENCOUNTER — Ambulatory Visit (INDEPENDENT_AMBULATORY_CARE_PROVIDER_SITE_OTHER): Payer: Medicare Other | Admitting: Urology

## 2015-12-12 VITALS — BP 120/72 | HR 91 | Ht 66.0 in | Wt 161.4 lb

## 2015-12-12 DIAGNOSIS — R32 Unspecified urinary incontinence: Secondary | ICD-10-CM | POA: Diagnosis not present

## 2015-12-12 DIAGNOSIS — R300 Dysuria: Secondary | ICD-10-CM

## 2015-12-12 DIAGNOSIS — N8110 Cystocele, unspecified: Secondary | ICD-10-CM | POA: Diagnosis not present

## 2015-12-12 DIAGNOSIS — IMO0002 Reserved for concepts with insufficient information to code with codable children: Secondary | ICD-10-CM

## 2015-12-12 DIAGNOSIS — IMO0001 Reserved for inherently not codable concepts without codable children: Secondary | ICD-10-CM

## 2015-12-12 LAB — BLADDER SCAN AMB NON-IMAGING: SCAN RESULT: 205

## 2015-12-12 MED ORDER — PRASTERONE 6.5 MG VA INST: 1.0000 | VAGINAL_INSERT | Freq: Every day | VAGINAL | 12 refills | Status: AC

## 2015-12-12 NOTE — Patient Instructions (Signed)
  I have given you a prescription for prasterone [place]).  If you find the medication too expensive, please call the office at [phone] for an alternative.

## 2015-12-12 NOTE — Progress Notes (Signed)
12/12/2015 11:34 AM   Stephanie Bailey 01-Oct-1940 [id number]  Referring provider: Ezequiel Kayser, MD [facility] [place], [place] [address]  Chief Complaint  Patient presents with  . Dysuria    referred by Dr. Dorthula Perfect    HPI: Patient is a 75 -year-old [place] American female who is referred to [place] by, Dr. Raechel Ache, for urinary incontinence.  Patient states that she has had urinary incontinence for years.  Patient has incontinence with urgency.   She states "I couldn't dare try to guess" on how many trips she makes to the bathroom  She "just stays in the bathroom and sometimes she sits down on the toilet and nothing comes out."   She is experiencing 0 to 4 nocturia episodes nightly.    Her incontinence volume is small.   She is continually changing pads to keep clean.    She is having associated urinary frequency, urgency and dysuria.  She denies intermittency, hesitancy, straining to urinate and weak urinary stream.     She does not have a history of urinary tract infections, STI's or injury to the bladder.   She denies gross hematuria, suprapubic pain, back pain, abdominal pain or flank pain.   She has not had any recent fevers, chills, nausea or vomiting.   She does not have a history of nephrolithiasis, GU surgery or GU trauma.   She is not sexually active.  She is post menopausal.  She admits to constipation.    She is drinking 2 to 4 glasses of water daily.   She is drinking one caffeinated beverages a week.  She is drinking no alcoholic beverages daily.    Her risk factors for incontinence are vaginal delivery, a family history of incontinence, age, caffeine, diabetes, stroke, depression, fecal incontinence, vaginal atrophy.  She is taking ACE inhibitors and diuretics.      She had been she in [place] about 20 years ago and it sounds like she had UDS or a cystoscopy.  She doesn't remember much more about those visits.  She saw Dr.Daniels about 10  years ago and he recommended surgery, but she thought she was too old for surgery.  PMH: Past Medical History:  Diagnosis Date  . Anxiety   . Atrial fibrillation ([place])   . Atrial flutter ([place])   . Breast cancer ([place])   . Breast cancer, left breast ([place]) 03/03/2015  . CHF (congestive heart failure) ([place])   . Depression   . Diabetes ([place])   . HTN (hypertension)   . Kidney failure   . Overactive bladder   . Pacemaker april 2009    Surgical History: Past Surgical History:  Procedure Laterality Date  . HERNIA REPAIR    . LAPAROSCOPIC OOPHERECTOMY    . MASTECTOMY Left april 2008  . MASTECTOMY Right 1999    Home Medications:    Medication List       Accurate as of 12/12/15 11:34 AM. Always use your most recent med list.          Acetaminophen 500 MG coapsule Take by mouth.   allopurinol 300 MG tablet Commonly known as:  ZYLOPRIM Take 300 mg by mouth daily.   anastrozole 1 MG tablet Commonly known as:  ARIMIDEX Take 1 mg by mouth daily.   benzonatate 100 MG capsule Commonly known as:  TESSALON Take by mouth.   brimonidine 0.2 % ophthalmic solution Commonly known as:  ALPHAGAN Place 1 drop into both eyes 2 (two) times  daily.   colchicine 0.6 MG tablet Take 0.6 mg by mouth daily.   digoxin 0.125 MG tablet Commonly known as:  LANOXIN Take 0.125 mg by mouth every Monday, Wednesday, and Friday.   docusate sodium 100 MG capsule Commonly known as:  COLACE Take 100 mg by mouth 2 (two) times daily. 1 cap in the morning; 2 caps in the evening   ferrous sulfate 325 (65 FE) MG tablet Take 325 mg by mouth daily with breakfast.   insulin glargine 100 UNIT/ML injection Commonly known as:  LANTUS Inject 10 Units into the skin at bedtime.   insulin regular 100 units/mL injection Commonly known as:  NOVOLIN R,HUMULIN R Inject into the skin 3 (three) times daily before meals. 4 units before breakfast, 2 units before lunch, 4 units before dinner   NOVOLIN R 100  units/mL injection Generic drug:  insulin regular INJECT 2-4 UNITS SUB-Q 3X A DAY BEFORE MEALS (4U WITH BREAKFAST, 2U WITH LUNCH, 4U WITH DINNER)   isosorbide mononitrate 30 MG 24 hr tablet Commonly known as:  IMDUR Take 30 mg by mouth daily.   latanoprost 0.005 % ophthalmic solution Commonly known as:  XALATAN Place 1 drop into both eyes at bedtime.   lisinopril 40 MG tablet Commonly known as:  PRINIVIL,ZESTRIL Take 40 mg by mouth daily.   metoprolol 100 MG tablet Commonly known as:  LOPRESSOR Take 100 mg by mouth 2 (two) times daily. 1 tab in the morning, 0.5 tab in the evening.   metoprolol succinate 100 MG 24 hr tablet Commonly known as:  TOPROL-XL Take by mouth.   MULTIVITAMIN ADULT PO Take by mouth.   multivitamin tablet Take 1 tablet by mouth daily.   omega-3 acid ethyl esters 1 g capsule Commonly known as:  LOVAZA Take 1 g by mouth daily.   Prasterone 6.5 MG Inst Commonly known as:  INTRAROSA Place 1 suppository vaginally daily.   risperiDONE 1 MG tablet Commonly known as:  RISPERDAL Take 1 mg by mouth daily.   simvastatin 40 MG tablet Commonly known as:  ZOCOR Take 40 mg by mouth daily at 6 PM.   spironolactone 25 MG tablet Commonly known as:  ALDACTONE Take 25 mg by mouth daily.   traMADol 50 MG tablet Commonly known as:  ULTRAM Take 1 tablet (50 mg total) by mouth every 6 (six) hours as needed.   warfarin 5 MG tablet Commonly known as:  COUMADIN Take 5 mg by mouth daily. 1.5 tabs on Monday; 1 tab every other day of the week.       Allergies:  Allergies  Allergen Reactions  . Heparin     Family History: Family History  Problem Relation Age of Onset  . Prostate cancer Brother   . Kidney disease Neg Hx   . Bladder Cancer Neg Hx     Social History:  reports that she has never smoked. She has never used smokeless tobacco. She reports that she does not drink alcohol or use drugs.  ROS: UROLOGY Frequent Urination?: Yes Hard to  postpone urination?: Yes Burning/pain with urination?: Yes Get up at night to urinate?: Yes Leakage of urine?: Yes Urine stream starts and stops?: No Trouble starting stream?: No Do you have to strain to urinate?: No Blood in urine?: No Urinary tract infection?: No Sexually transmitted disease?: No Injury to kidneys or bladder?: No Painful intercourse?: No Weak stream?: No Currently pregnant?: No Vaginal bleeding?: No Last menstrual period?: n  Gastrointestinal Nausea?: No Vomiting?: No Indigestion/heartburn?: No  Diarrhea?: No Constipation?: No  Constitutional Fever: No Night sweats?: No Weight loss?: No Fatigue?: No  Skin Skin rash/lesions?: No Itching?: No  Eyes Blurred vision?: No Double vision?: No  Ears/Nose/Throat Sore throat?: No Sinus problems?: No  Hematologic/Lymphatic Swollen glands?: No Easy bruising?: No  Cardiovascular Leg swelling?: No Chest pain?: No  Respiratory Cough?: No Shortness of breath?: No  Endocrine Excessive thirst?: No  Musculoskeletal Back pain?: No Joint pain?: No  Neurological Headaches?: No Dizziness?: No  Psychologic Depression?: No Anxiety?: No  Physical Exam: BP 120/72   Pulse 91   Ht 5\' 6"  (1.676 m)   Wt 161 lb 6.4 oz (73.2 kg)   BMI 26.05 kg/m   Constitutional: Well nourished. Alert and oriented, No acute distress. HEENT: [place] AT, moist mucus membranes. Trachea midline, no masses. Cardiovascular: No clubbing, cyanosis, or edema. Respiratory: Normal respiratory effort, no increased work of breathing. GI: Abdomen is soft, non tender, non distended, no abdominal masses. Liver and spleen not palpable.  No hernias appreciated.  Stool sample for occult testing is not indicated.   GU: No CVA tenderness.  No bladder fullness or masses.  Atrophic external genitalia, normal pubic hair distribution, no lesions.  Normal urethral meatus, no lesions, no prolapse, no discharge.   No urethral masses, tenderness  and/or tenderness. No bladder fullness, tenderness or masses. Pale vagina mucosa, poor estrogen effect, no discharge, no lesions, good pelvic support, no cystocele or rectocele noted.  No cervical motion tenderness.  Uterus is freely mobile and non-fixed.  No pelvic masses or tenderness noted.  Anus and perineum are without rashes or lesions.    Skin: No rashes, bruises or suspicious lesions. Lymph: No cervical or inguinal adenopathy. Neurologic: Grossly intact, no focal deficits, moving all 4 extremities. Psychiatric: Normal mood and affect.  Laboratory Data: Lab Results  Component Value Date   WBC 13.2 (H) 04/15/2015   HGB 11.5 (L) 04/15/2015   HCT 35.8 04/15/2015   MCV 89.3 04/15/2015   PLT 195 04/15/2015    Lab Results  Component Value Date   CREATININE 2.07 (H) 04/15/2015    Lab Results  Component Value Date   AST 19 04/15/2015   Lab Results  Component Value Date   ALT 13 (L) 04/15/2015      Pertinent Imaging: Results for ONI, ORE (MRN [id number]) as of 12/12/2015 11:15  Ref. Range 12/12/2015 11:03  Scan Result Unknown [id number]    Assessment & Plan:    1. Dysuria  - most likely due to vaginal atrophy  - prescribed Intrarosa suppositories, once daily  - asked to contact the office if medication is too expensive for alternatives  - RTC in 3 months for a recheck of symptoms  2. Incontinence  - offered behavioral therapies; bladder training, bladder control strategies, pelvic floor muscle training and fluid management- patient felt she was not a good candidate due to her age  - explained that she may have incontinence due to incomplete bladder emptying  - offered refer to gynecology for a pessary fitting - she agrees; referral made  - RTC in 3 month for PVR and symptom recheck   - BLADDER SCAN AMB NON-IMAGING  3. Cystocele   -refer to gynecology for pessary fitting   Return in about 3 months (around 03/12/2016) for exam and PVR.  These notes generated with  voice recognition software. I apologize for typographical errors.  Stephanie Bailey, [facility] [address], [place], [place] [address] [phone]

## 2015-12-19 DIAGNOSIS — Z45018 Encounter for adjustment and management of other part of cardiac pacemaker: Secondary | ICD-10-CM | POA: Insufficient documentation

## 2016-02-21 ENCOUNTER — Encounter: Payer: Medicare Other | Admitting: Obstetrics and Gynecology

## 2016-02-28 DIAGNOSIS — I38 Endocarditis, valve unspecified: Secondary | ICD-10-CM | POA: Insufficient documentation

## 2016-02-28 DIAGNOSIS — I5041 Acute combined systolic (congestive) and diastolic (congestive) heart failure: Secondary | ICD-10-CM

## 2016-03-04 ENCOUNTER — Inpatient Hospital Stay: Payer: Medicare Other

## 2016-03-04 ENCOUNTER — Ambulatory Visit: Payer: Medicare Other | Admitting: Oncology

## 2016-03-04 ENCOUNTER — Inpatient Hospital Stay: Payer: Medicare Other | Admitting: Internal Medicine

## 2016-03-11 NOTE — Progress Notes (Signed)
03/12/2016 11:21 AM   Stephanie Bailey Jun 26, 1940 [id number]  Referring provider: Ezequiel Kayser, MD [facility] [place], [place] [address]  Chief Complaint  Patient presents with  . Urinary Incontinence    3 month follow up  also cystocele    HPI: Patient is a 75 year old African-American female who presents today for 3 month follow-up for urinary incontinence, dysuria and vaginal atrophy.  Background history Patient was referred to [place] by, Dr. Raechel Ache, for urinary incontinence.  Patient states that she has had urinary incontinence for years.  Patient has incontinence with urgency.   She states "I couldn't dare try to guess" on how many trips she makes to the bathroom  She "just stays in the bathroom and sometimes she sits down on the toilet and nothing comes out."   She is experiencing 0 to 4 nocturia episodes nightly.  Her incontinence volume is small.   She is continually changing pads to keep clean.  She is having associated urinary frequency, urgency and dysuria.  She denies intermittency, hesitancy, straining to urinate and weak urinary stream.   She does not have a history of urinary tract infections, STI's or injury to the bladder.   She denies gross hematuria, suprapubic pain, back pain, abdominal pain or flank pain.   She has not had any recent fevers, chills, nausea or vomiting.  She does not have a history of nephrolithiasis, GU surgery or GU trauma.  She is not sexually active.  She is post menopausal.  She admits to constipation.  She is drinking 2 to 4 glasses of water daily.   She is drinking one caffeinated beverages a week.  She is drinking no alcoholic beverages daily.  Her risk factors for incontinence are vaginal delivery, a family history of incontinence, age, caffeine, diabetes, stroke, depression, fecal incontinence, vaginal atrophy.  She is taking ACE inhibitors and diuretics.   She had been she in [place] about 20 years ago and it sounds like she  had UDS or a cystoscopy.  She doesn't remember much more about those visits.  She saw Dr.Daniels about 10 years ago and he recommended surgery, but she thought she was too old for surgery.  At her initial visit, she was referred to gynecology for a pessary fitting and started on Intrarosa for her vaginal atrophy.  She was not able to acquire the Intrarosa.  She is bothered a great deal with an uncomfortable urge to urinate and accidental loss of small amounts of urine.  She is bothered quite a bit with night time urination, waking up at night to urinate and urine loss associated with a strong desire to urinate.  She has an upcoming appointment with gynecology in January.    She is having more urge incontinence symptoms at this time.  Her PVR at today's visit was 79 mL.  She has been drinking two and one half bottles of water daily.    PMH: Past Medical History:  Diagnosis Date  . Anxiety   . Atrial fibrillation ([place])   . Atrial flutter ([place])   . Breast cancer ([place])   . Breast cancer, left breast ([place]) 03/03/2015  . CHF (congestive heart failure) ([place])   . Depression   . Diabetes ([place])   . HTN (hypertension)   . Kidney failure   . Overactive bladder   . Pacemaker april 2009    Surgical History: Past Surgical History:  Procedure Laterality Date  . HERNIA REPAIR    .  LAPAROSCOPIC OOPHERECTOMY    . MASTECTOMY Left april 2008  . MASTECTOMY Right 1999    Home Medications:  Allergies as of 03/12/2016      Reactions   Heparin       Medication List       Accurate as of 03/12/16 11:21 AM. Always use your most recent med list.          Acetaminophen 500 MG coapsule Take by mouth.   allopurinol 300 MG tablet Commonly known as:  ZYLOPRIM Take 300 mg by mouth daily.   anastrozole 1 MG tablet Commonly known as:  ARIMIDEX Take 1 mg by mouth daily.   BD INSULIN SYRINGE ULTRAFINE 31G X 5/16" 0.3 ML Misc Generic drug:  Insulin Syringe-Needle U-100 AS DIRECTED (4 TIMES DAILY  WITH LANTUS AND HUMULIN R INSULIN).   benzonatate 100 MG capsule Commonly known as:  TESSALON Take by mouth.   brimonidine 0.2 % ophthalmic solution Commonly known as:  ALPHAGAN Place 1 drop into both eyes 2 (two) times daily.   colchicine 0.6 MG tablet Take 0.6 mg by mouth daily.   digoxin 0.125 MG tablet Commonly known as:  LANOXIN Take 0.125 mg by mouth every Monday, Wednesday, and Friday.   docusate sodium 100 MG capsule Commonly known as:  COLACE Take 100 mg by mouth 2 (two) times daily. 1 cap in the morning; 2 caps in the evening   ferrous sulfate 325 (65 FE) MG tablet Take 325 mg by mouth daily with breakfast.   hydrALAZINE 50 MG tablet Commonly known as:  APRESOLINE Take by mouth.   insulin glargine 100 UNIT/ML injection Commonly known as:  LANTUS Inject 10 Units into the skin at bedtime.   insulin regular 250 units/2.46mL (100 units/mL) injection Commonly known as:  NOVOLIN R,HUMULIN R Inject into the skin 3 (three) times daily before meals. 4 units before breakfast, 2 units before lunch, 4 units before dinner   NOVOLIN R 250 units/2.51mL (100 units/mL) injection Generic drug:  insulin regular INJECT 2-4 UNITS SUB-Q 3X A DAY BEFORE MEALS (4U WITH BREAKFAST, 2U WITH LUNCH, 4U WITH DINNER)   isosorbide mononitrate 30 MG 24 hr tablet Commonly known as:  IMDUR Take 30 mg by mouth daily.   latanoprost 0.005 % ophthalmic solution Commonly known as:  XALATAN Place 1 drop into both eyes at bedtime.   lisinopril 40 MG tablet Commonly known as:  PRINIVIL,ZESTRIL Take 40 mg by mouth daily.   metoprolol 100 MG tablet Commonly known as:  LOPRESSOR Take 100 mg by mouth 2 (two) times daily. 1 tab in the morning, 0.5 tab in the evening.   metoprolol succinate 100 MG 24 hr tablet Commonly known as:  TOPROL-XL Take by mouth.   MULTIVITAMIN ADULT PO Take by mouth.   multivitamin tablet Take 1 tablet by mouth daily.   omega-3 acid ethyl esters 1 g  capsule Commonly known as:  LOVAZA Take 1 g by mouth daily.   Prasterone 6.5 MG Inst Commonly known as:  INTRAROSA Place 1 suppository vaginally daily.   risperiDONE 1 MG tablet Commonly known as:  RISPERDAL Take 1 mg by mouth daily.   simvastatin 40 MG tablet Commonly known as:  ZOCOR Take 40 mg by mouth daily at 6 PM.   spironolactone 25 MG tablet Commonly known as:  ALDACTONE Take 25 mg by mouth daily.   torsemide 20 MG tablet Commonly known as:  DEMADEX 4 pills twice daily  But 5 pills twice daily for weight 165 lbs  or more 3 pills twice for weight 158 lbs or les   traMADol 50 MG tablet Commonly known as:  ULTRAM Take 1 tablet (50 mg total) by mouth every 6 (six) hours as needed.   warfarin 5 MG tablet Commonly known as:  COUMADIN Take 5 mg by mouth daily. 1.5 tabs on Monday; 1 tab every other day of the week.       Allergies:  Allergies  Allergen Reactions  . Heparin     Family History: Family History  Problem Relation Age of Onset  . Prostate cancer Brother   . Kidney disease Neg Hx   . Bladder Cancer Neg Hx     Social History:  reports that she has never smoked. She has never used smokeless tobacco. She reports that she does not drink alcohol or use drugs.  ROS: UROLOGY Frequent Urination?: Yes Hard to postpone urination?: No Burning/pain with urination?: No Get up at night to urinate?: No Leakage of urine?: No Urine stream starts and stops?: No Trouble starting stream?: No Do you have to strain to urinate?: No Blood in urine?: No Urinary tract infection?: No Sexually transmitted disease?: No Injury to kidneys or bladder?: No Painful intercourse?: No Weak stream?: No Currently pregnant?: No Vaginal bleeding?: No Last menstrual period?: n  Gastrointestinal Nausea?: No Vomiting?: No Indigestion/heartburn?: No Diarrhea?: No Constipation?: No  Constitutional Fever: No Night sweats?: No Weight loss?: No Fatigue?: No  Skin Skin  rash/lesions?: No Itching?: No  Eyes Blurred vision?: No Double vision?: No  Ears/Nose/Throat Sore throat?: No Sinus problems?: No  Hematologic/Lymphatic Swollen glands?: No Easy bruising?: No  Cardiovascular Leg swelling?: No Chest pain?: No  Respiratory Cough?: No Shortness of breath?: No  Endocrine Excessive thirst?: No  Musculoskeletal Back pain?: No Joint pain?: No  Neurological Headaches?: No Dizziness?: No  Psychologic Depression?: No Anxiety?: No  Physical Exam: BP 106/67   Pulse 94   Ht 5\' 6"  (1.676 m)   Wt 157 lb 4.8 oz (71.4 kg)   BMI 25.39 kg/m   Constitutional: Well nourished. Alert and oriented, No acute distress. HEENT: [place] AT, moist mucus membranes. Trachea midline, no masses. Cardiovascular: No clubbing, cyanosis, or edema. Respiratory: Normal respiratory effort, no increased work of breathing. GI: Abdomen is soft, non tender, non distended, no abdominal masses. Liver and spleen not palpable.  No hernias appreciated.  Stool sample for occult testing is not indicated.   GU: No CVA tenderness.  No bladder fullness or masses.   Skin: No rashes, bruises or suspicious lesions. Lymph: No cervical or inguinal adenopathy. Neurologic: Grossly intact, no focal deficits, moving all 4 extremities. Psychiatric: Normal mood and affect.  Laboratory Data: Lab Results  Component Value Date   WBC 13.2 (H) 04/15/2015   HGB 11.5 (L) 04/15/2015   HCT 35.8 04/15/2015   MCV 89.3 04/15/2015   PLT 195 04/15/2015    Lab Results  Component Value Date   CREATININE 2.07 (H) 04/15/2015    Lab Results  Component Value Date   AST 19 04/15/2015   Lab Results  Component Value Date   ALT 13 (L) 04/15/2015    Pertinent Imaging: Results for AMEERAH, INSANA (MRN [id number]) as of 03/12/2016 11:08  Ref. Range 03/12/2016 11:07  Scan Result Unknown 79     Assessment & Plan:    1. Dysuria  - resolved  2. Mixed Incontinence  - offered behavioral  therapies; bladder training, bladder control strategies, pelvic floor muscle training and fluid management- patient felt she  was not a good candidate due to her age  - explained that she may have incontinence due to incomplete bladder emptying  - offered refer to gynecology for a pessary fitting - she agrees; appointment in January  - will give a trial of Myrbetriq 50 mg daily,  I have advised the patient of the side effects of Myrbetriq, such as: elevation in BP, urinary retention and/or HA.  - RTC in 3 weeks for PVR and symptoms  - BLADDER SCAN AMB NON-IMAGING  3. Cystocele   -refer to gynecology for pessary fitting   Return in about 3 weeks (around 04/02/2016) for PVR and OAB questionnaire.  These notes generated with voice recognition software. I apologize for typographical errors.  Zara Council, [facility] [address] [place], [place] [address] [phone]

## 2016-03-12 ENCOUNTER — Ambulatory Visit (INDEPENDENT_AMBULATORY_CARE_PROVIDER_SITE_OTHER): Payer: Medicare Other | Admitting: Urology

## 2016-03-12 ENCOUNTER — Encounter: Payer: Self-pay | Admitting: Urology

## 2016-03-12 VITALS — BP 106/67 | HR 94 | Ht 66.0 in | Wt 157.3 lb

## 2016-03-12 DIAGNOSIS — N3946 Mixed incontinence: Secondary | ICD-10-CM | POA: Diagnosis not present

## 2016-03-12 DIAGNOSIS — N8111 Cystocele, midline: Secondary | ICD-10-CM

## 2016-03-12 DIAGNOSIS — R300 Dysuria: Secondary | ICD-10-CM

## 2016-03-12 DIAGNOSIS — R32 Unspecified urinary incontinence: Secondary | ICD-10-CM

## 2016-03-12 LAB — BLADDER SCAN AMB NON-IMAGING: SCAN RESULT: 79

## 2016-03-22 ENCOUNTER — Inpatient Hospital Stay: Payer: Medicare Other

## 2016-03-26 ENCOUNTER — Encounter: Payer: Self-pay | Admitting: Oncology

## 2016-03-26 ENCOUNTER — Inpatient Hospital Stay: Payer: Medicare Other

## 2016-03-26 ENCOUNTER — Inpatient Hospital Stay: Payer: Medicare Other | Attending: Oncology | Admitting: Oncology

## 2016-03-26 VITALS — BP 124/77 | HR 69 | Temp 97.5°F | Resp 18 | Ht 66.0 in | Wt 158.2 lb

## 2016-03-26 DIAGNOSIS — R600 Localized edema: Secondary | ICD-10-CM | POA: Diagnosis not present

## 2016-03-26 DIAGNOSIS — Z170 Estrogen receptor positive status [ER+]: Secondary | ICD-10-CM | POA: Diagnosis not present

## 2016-03-26 DIAGNOSIS — Z9223 Personal history of estrogen therapy: Secondary | ICD-10-CM | POA: Diagnosis not present

## 2016-03-26 DIAGNOSIS — I110 Hypertensive heart disease with heart failure: Secondary | ICD-10-CM | POA: Diagnosis not present

## 2016-03-26 DIAGNOSIS — I4891 Unspecified atrial fibrillation: Secondary | ICD-10-CM | POA: Diagnosis not present

## 2016-03-26 DIAGNOSIS — C50912 Malignant neoplasm of unspecified site of left female breast: Secondary | ICD-10-CM

## 2016-03-26 DIAGNOSIS — I509 Heart failure, unspecified: Secondary | ICD-10-CM | POA: Diagnosis not present

## 2016-03-26 DIAGNOSIS — E119 Type 2 diabetes mellitus without complications: Secondary | ICD-10-CM | POA: Diagnosis not present

## 2016-03-26 DIAGNOSIS — Z950 Presence of cardiac pacemaker: Secondary | ICD-10-CM | POA: Diagnosis not present

## 2016-03-26 DIAGNOSIS — Z9013 Acquired absence of bilateral breasts and nipples: Secondary | ICD-10-CM | POA: Insufficient documentation

## 2016-03-26 DIAGNOSIS — Z794 Long term (current) use of insulin: Secondary | ICD-10-CM | POA: Insufficient documentation

## 2016-03-26 DIAGNOSIS — Z7901 Long term (current) use of anticoagulants: Secondary | ICD-10-CM | POA: Diagnosis not present

## 2016-03-26 DIAGNOSIS — I4892 Unspecified atrial flutter: Secondary | ICD-10-CM | POA: Insufficient documentation

## 2016-03-26 DIAGNOSIS — Z79899 Other long term (current) drug therapy: Secondary | ICD-10-CM | POA: Insufficient documentation

## 2016-03-26 DIAGNOSIS — Z853 Personal history of malignant neoplasm of breast: Secondary | ICD-10-CM

## 2016-03-26 NOTE — Progress Notes (Signed)
Hematology/Oncology Consult note [facility]  [phone] [phone]  Patient Care Team: Ezequiel Kayser, MD as PCP - General (Internal Medicine)   Name of the patient: Stephanie Bailey  [id number]  1941/02/10   Date of visit: 03/26/16  Diagnosis- history of bilateral breast cancer  Chief complaint/ Reason for visit- routine follow-up of breast cancer  Heme/Onc history: Patient is a 76 year old female with a history of bilateral breast carcinoma. Patient's first mastectomy was on the right side in 1999. She then had left breast cancer in October 2007 status post left mastectomy in April 2008. She also was started on Arimidex which she took until 2015 and then was discontinued. She sees [place] on a yearly basis  Interval history- overall she is doing well. She does have problems of heart failure and has been trying to cut down on her fluid and salt intake. Other than that she reports doing well. She has a good appetite and denies any unintentional weight loss or aches or pains anywhere. ECOG PS- 1   Review of systems- Review of Systems  Constitutional: Negative for chills, fever, malaise/fatigue and weight loss.  HENT: Negative for congestion, ear discharge and nosebleeds.   Eyes: Negative for blurred vision.  Respiratory: Negative for cough, hemoptysis, sputum production, shortness of breath and wheezing.   Cardiovascular: Positive for leg swelling (On and off). Negative for chest pain, palpitations, orthopnea and claudication.  Gastrointestinal: Negative for abdominal pain, blood in stool, constipation, diarrhea, heartburn, melena, nausea and vomiting.  Genitourinary: Negative for dysuria, flank pain, frequency, hematuria and urgency.  Musculoskeletal: Negative for back pain, joint pain and myalgias.  Skin: Negative for rash.  Neurological: Negative for dizziness, tingling, focal weakness, seizures, weakness and headaches.  Endo/Heme/Allergies: Does  not bruise/bleed easily.  Psychiatric/Behavioral: Negative for depression and suicidal ideas. The patient does not have insomnia.      Current treatment- observation  Allergies  Allergen Reactions  . Heparin      Past Medical History:  Diagnosis Date  . Anxiety   . Atrial fibrillation ([place])   . Atrial flutter ([place])   . Breast cancer ([place])   . Breast cancer, left breast ([place]) 03/03/2015  . CHF (congestive heart failure) ([place])   . Depression   . Diabetes ([place])   . HTN (hypertension)   . Kidney failure   . Overactive bladder   . Pacemaker april 2009     Past Surgical History:  Procedure Laterality Date  . HERNIA REPAIR    . LAPAROSCOPIC OOPHERECTOMY    . MASTECTOMY Left april 2008  . MASTECTOMY Right 1999    Social History   Social History  . Marital status: Widowed    Spouse name: N/A  . Number of children: N/A  . Years of education: N/A   Occupational History  . Not on file.   Social History Main Topics  . Smoking status: Never Smoker  . Smokeless tobacco: Never Used  . Alcohol use No  . Drug use: No  . Sexual activity: Not on file   Other Topics Concern  . Not on file   Social History Narrative  . No narrative on file    Family History  Problem Relation Age of Onset  . Prostate cancer Brother   . Kidney disease Neg Hx   . Bladder Cancer Neg Hx      Current Outpatient Prescriptions:  .  Acetaminophen 500 MG coapsule, Take by mouth., Disp: , Rfl:  .  allopurinol (ZYLOPRIM) 300 MG tablet, Take 300 mg by mouth daily., Disp: , Rfl:  .  anastrozole (ARIMIDEX) 1 MG tablet, Take 1 mg by mouth daily., Disp: , Rfl:  .  benzonatate (TESSALON) 100 MG capsule, Take by mouth., Disp: , Rfl:  .  brimonidine (ALPHAGAN) 0.2 % ophthalmic solution, Place 1 drop into both eyes 2 (two) times daily., Disp: , Rfl:  .  colchicine 0.6 MG tablet, Take 0.6 mg by mouth daily., Disp: , Rfl:  .  digoxin (LANOXIN) 0.125 MG tablet, Take 0.125 mg by mouth every Monday,  Wednesday, and Friday., Disp: , Rfl:  .  docusate sodium (COLACE) 100 MG capsule, Take 100 mg by mouth 2 (two) times daily. 1 cap in the morning; 2 caps in the evening, Disp: , Rfl:  .  ferrous sulfate 325 (65 FE) MG tablet, Take 325 mg by mouth daily with breakfast., Disp: , Rfl:  .  hydrALAZINE (APRESOLINE) 50 MG tablet, Take by mouth., Disp: , Rfl:  .  insulin glargine (LANTUS) 100 UNIT/ML injection, Inject 10 Units into the skin at bedtime., Disp: , Rfl:  .  insulin regular (NOVOLIN R) 100 units/mL injection, INJECT 2-4 UNITS SUB-Q 3X A DAY BEFORE MEALS (4U WITH BREAKFAST, 2U WITH LUNCH, 4U WITH DINNER), Disp: , Rfl:  .  insulin regular (NOVOLIN R,HUMULIN R) 100 units/mL injection, Inject into the skin 3 (three) times daily before meals. 4 units before breakfast, 2 units before lunch, 4 units before dinner, Disp: , Rfl:  .  Insulin Syringe-Needle U-100 (BD INSULIN SYRINGE ULTRAFINE) 31G X 5/16" 0.3 ML MISC, AS DIRECTED (4 TIMES DAILY WITH LANTUS AND HUMULIN R INSULIN)., Disp: , Rfl:  .  isosorbide mononitrate (IMDUR) 30 MG 24 hr tablet, Take 30 mg by mouth daily., Disp: , Rfl:  .  latanoprost (XALATAN) 0.005 % ophthalmic solution, Place 1 drop into both eyes at bedtime., Disp: , Rfl:  .  lisinopril (PRINIVIL,ZESTRIL) 40 MG tablet, Take 40 mg by mouth daily., Disp: , Rfl:  .  metoprolol (LOPRESSOR) 100 MG tablet, Take 100 mg by mouth 2 (two) times daily. 1 tab in the morning, 0.5 tab in the evening., Disp: , Rfl:  .  metoprolol succinate (TOPROL-XL) 100 MG 24 hr tablet, Take by mouth., Disp: , Rfl:  .  Multiple Vitamin (MULTIVITAMIN) tablet, Take 1 tablet by mouth daily., Disp: , Rfl:  .  Multiple Vitamins-Minerals (MULTIVITAMIN ADULT PO), Take by mouth., Disp: , Rfl:  .  omega-3 acid ethyl esters (LOVAZA) 1 G capsule, Take 1 g by mouth daily., Disp: , Rfl:  .  Prasterone (INTRAROSA) 6.5 MG INST, Place 1 suppository vaginally daily. (Patient not taking: Reported on 03/12/2016), Disp: 30 each, Rfl:  12 .  risperiDONE (RISPERDAL) 1 MG tablet, Take 1 mg by mouth daily., Disp: , Rfl:  .  simvastatin (ZOCOR) 40 MG tablet, Take 40 mg by mouth daily at 6 PM., Disp: , Rfl:  .  spironolactone (ALDACTONE) 25 MG tablet, Take 25 mg by mouth daily., Disp: , Rfl:  .  torsemide (DEMADEX) 20 MG tablet, 4 pills twice daily  But 5 pills twice daily for weight 165 lbs or more 3 pills twice for weight 158 lbs or les, Disp: , Rfl:  .  traMADol (ULTRAM) 50 MG tablet, Take 1 tablet (50 mg total) by mouth every 6 (six) hours as needed. (Patient not taking: Reported on 03/12/2016), Disp: 20 tablet, Rfl: 0 .  warfarin (COUMADIN) 5 MG tablet, Take 5 mg by  mouth daily. 1.5 tabs on Monday; 1 tab every other day of the week., Disp: , Rfl:   Physical exam:  Vitals:   03/26/16 1048  BP: 124/77  Pulse: 69  Resp: 18  Temp: 97.5 F (36.4 C)  TempSrc: Tympanic  Weight: 158 lb 2.9 oz (71.7 kg)  Height: 5\' 6"  (1.676 m)   Physical Exam  Constitutional: She is oriented to person, place, and time and well-developed, well-nourished, and in no distress.  HENT:  Head: Normocephalic and atraumatic.  Eyes: EOM are normal. Pupils are equal, round, and reactive to light.  Neck: Normal range of motion.  Cardiovascular: Normal rate, regular rhythm and normal heart sounds.   Pulmonary/Chest: Effort normal and breath sounds normal.  Abdominal: Soft. Bowel sounds are normal.  Neurological: She is alert and oriented to person, place, and time.  Skin: Skin is warm and dry.   Breast exam is performed in seated and lying down position. Patient is status post bilateral mastectomy without reconstruction. No evidence of any chest wall recurrence. No evidence of bilateral axillary adenopathy    CMP Latest Ref Rng & Units 04/15/2015  Glucose 65 - 99 mg/dL 103(H)  BUN 6 - 20 mg/dL 65(H)  Creatinine 0.44 - 1.00 mg/dL 2.07(H)  Sodium 135 - 145 mmol/L 139  Potassium 3.5 - 5.1 mmol/L 4.1  Chloride 101 - 111 mmol/L 104  CO2 22 - 32  mmol/L 27  Calcium 8.9 - 10.3 mg/dL 9.4  Total Protein 6.5 - 8.1 g/dL 7.8  Total Bilirubin 0.3 - 1.2 mg/dL 1.3(H)  Alkaline Phos 38 - 126 U/L 70  AST 15 - 41 U/L 19  ALT 14 - 54 U/L 13(L)   CBC Latest Ref Rng & Units 04/15/2015  WBC 3.6 - 11.0 K/uL 13.2(H)  Hemoglobin 12.0 - 16.0 g/dL 11.5(L)  Hematocrit 35.0 - 47.0 % 35.8  Platelets 150 - 440 K/uL 195     Assessment and plan- Patient is a 76 y.o. female with a history of bilateral breast cancer status post bilateral mastectomy  1. Clinically patient is doing well and there is no evidence of recurrence on today's exam. We will see her back in one year's time without any blood work   Visit Diagnosis 1. Malignant neoplasm of left breast in female, estrogen receptor positive, unspecified site of breast ([place])      Dr. Randa Evens, MD, MPH [facility] at [facility] Pager- [phone] 03/26/2016 9:35 AM                 Dr.

## 2016-03-27 ENCOUNTER — Encounter: Payer: Self-pay | Admitting: Obstetrics and Gynecology

## 2016-03-27 ENCOUNTER — Ambulatory Visit (INDEPENDENT_AMBULATORY_CARE_PROVIDER_SITE_OTHER): Payer: Medicare Other | Admitting: Obstetrics and Gynecology

## 2016-03-27 VITALS — BP 104/61 | HR 93 | Ht 66.0 in | Wt 156.3 lb

## 2016-03-27 DIAGNOSIS — N8111 Cystocele, midline: Secondary | ICD-10-CM

## 2016-03-27 DIAGNOSIS — Z90722 Acquired absence of ovaries, bilateral: Secondary | ICD-10-CM

## 2016-03-27 DIAGNOSIS — Z853 Personal history of malignant neoplasm of breast: Secondary | ICD-10-CM | POA: Diagnosis not present

## 2016-03-27 DIAGNOSIS — R32 Unspecified urinary incontinence: Secondary | ICD-10-CM | POA: Diagnosis not present

## 2016-03-27 DIAGNOSIS — Z9079 Acquired absence of other genital organ(s): Secondary | ICD-10-CM

## 2016-03-27 NOTE — Progress Notes (Signed)
GYN ENCOUNTER NOTE  Subjective:       Stephanie Bailey is a 76 y.o. [id number] female is here for gynecologic evaluation of the following issues:  1. Referral for pessary fitting 2. Cystocele 3. Urinary incontinence  Long history of urinary incontinence. Nocturia significant until starting Merbetriq. Spontaneous incontinence occurs at times. No significant stress incontinence. No history of chronic UTIs. Multiple risk factors for urinary incontinence include menopause, vaginal atrophy with adhesive vaginal estrogen therapy because of breast cancer history, diabetes, history of stroke, history of childbirth. Recent urology evaluation demonstrated) with residual 79 ml   Gynecologic History No LMP recorded. Patient is postmenopausal. Contraception: N/A  Obstetric History OB History  Gravida Para Term Preterm AB Living  1       1    SAB TAB Ectopic Multiple Live Births      1        # Outcome Date GA Lbr Len/2nd Weight Sex Delivery Anes PTL Lv  1 Ectopic               Past Medical History:  Diagnosis Date  . Anxiety   . Atrial fibrillation ([place])   . Atrial flutter ([place])   . Breast cancer ([place])   . Breast cancer, left breast ([place]) 03/03/2015  . CHF (congestive heart failure) ([place])   . Depression   . Diabetes ([place])   . HTN (hypertension)   . Kidney failure   . Overactive bladder   . Pacemaker april 2009    Past Surgical History:  Procedure Laterality Date  . DILATION AND CURETTAGE OF UTERUS    . HERNIA REPAIR    . LAPAROSCOPIC OOPHERECTOMY    . MASTECTOMY Left april 2008  . MASTECTOMY Right 1999    Current Outpatient Prescriptions on File Prior to Visit  Medication Sig Dispense Refill  . Acetaminophen 500 MG coapsule Take by mouth.    Marland Kitchen allopurinol (ZYLOPRIM) 300 MG tablet Take 300 mg by mouth daily.    . benzonatate (TESSALON) 100 MG capsule Take by mouth.    . brimonidine (ALPHAGAN) 0.2 % ophthalmic solution Place 1 drop into both eyes 2 (two) times daily.    .  colchicine 0.6 MG tablet Take 0.6 mg by mouth daily.    . digoxin (LANOXIN) 0.125 MG tablet Take 0.125 mg by mouth every Monday, Wednesday, and Friday.    . docusate sodium (COLACE) 100 MG capsule Take 100 mg by mouth 2 (two) times daily. 1 cap in the morning; 2 caps in the evening    . ferrous sulfate 325 (65 FE) MG tablet Take 325 mg by mouth daily with breakfast.    . hydrALAZINE (APRESOLINE) 50 MG tablet Take by mouth.    . insulin glargine (LANTUS) 100 UNIT/ML injection Inject 8 Units into the skin at bedtime.     . insulin regular (NOVOLIN R) 100 units/mL injection INJECT 2-4 UNITS SUB-Q 3X A DAY BEFORE MEALS (4U WITH BREAKFAST, 2U WITH LUNCH, 4U WITH DINNER)    . insulin regular (NOVOLIN R,HUMULIN R) 100 units/mL injection Inject into the skin 3 (three) times daily before meals. 4 units before breakfast, 2 units before lunch, 4 units before dinner    . Insulin Syringe-Needle U-100 (BD INSULIN SYRINGE ULTRAFINE) 31G X 5/16" 0.3 ML MISC AS DIRECTED (4 TIMES DAILY WITH LANTUS AND HUMULIN R INSULIN).    Marland Kitchen isosorbide mononitrate (IMDUR) 30 MG 24 hr tablet Take 30 mg by mouth daily.    Marland Kitchen latanoprost (XALATAN) 0.005 %  ophthalmic solution Place 1 drop into both eyes at bedtime.    Marland Kitchen lisinopril (PRINIVIL,ZESTRIL) 40 MG tablet Take 40 mg by mouth daily.    . metoprolol (LOPRESSOR) 100 MG tablet Take 100 mg by mouth 2 (two) times daily. 1 tab in the morning, 0.5 tab in the evening.    . Multiple Vitamin (MULTIVITAMIN) tablet Take 1 tablet by mouth daily.    Marland Kitchen omega-3 acid ethyl esters (LOVAZA) 1 G capsule Take 1 g by mouth daily.    . Prasterone (INTRAROSA) 6.5 MG INST Place 1 suppository vaginally daily. 30 each 12  . risperiDONE (RISPERDAL) 1 MG tablet Take 1 mg by mouth daily.    . simvastatin (ZOCOR) 40 MG tablet Take 40 mg by mouth daily at 6 PM.    . spironolactone (ALDACTONE) 25 MG tablet Take 25 mg by mouth daily.    Marland Kitchen torsemide (DEMADEX) 20 MG tablet 3 pills twice daily  But 3 pills twice  daily for weight 165 lbs or more 3 pills twice for weight 158 lbs or les    . warfarin (COUMADIN) 5 MG tablet Take 5 mg by mouth daily. 1.5 tabs on Monday; 1 tab every other day of the week.     No current facility-administered medications on file prior to visit.     Allergies  Allergen Reactions  . Heparin     Social History   Social History  . Marital status: Widowed    Spouse name: N/A  . Number of children: N/A  . Years of education: N/A   Occupational History  . Not on file.   Social History Main Topics  . Smoking status: Never Smoker  . Smokeless tobacco: Never Used  . Alcohol use No  . Drug use: No  . Sexual activity: Not Currently    Birth control/ protection: Post-menopausal   Other Topics Concern  . Not on file   Social History Narrative  . No narrative on file    Family History  Problem Relation Age of Onset  . Prostate cancer Brother   . Diabetes Brother   . Breast cancer Sister   . Diabetes Sister   . Kidney disease Neg Hx   . Bladder Cancer Neg Hx   . Ovarian cancer Neg Hx   . Colon cancer Neg Hx     The following portions of the patient's history were reviewed and updated as appropriate: allergies, current medications, past family history, past medical history, past social history, past surgical history and problem list.  Review of Systems Review of Systems - Per history of present illness Objective:   BP 104/61   Pulse 93   Ht 5\' 6"  (1.676 m)   Wt 156 lb 4.8 oz (70.9 kg)   BMI 25.23 kg/m  CONSTITUTIONAL: Well-developed, well-nourished female in no acute distress.  HENT:  Normocephalic, atraumatic.  NECK:Not examined SKIN: Skin is warm and dry. No rash noted. Not diaphoretic. No erythema. No pallor. [place]: Alert and oriented to person, place, and time. PSYCHIATRIC: Normal mood and affect. Normal behavior. Normal judgment and thought content. CARDIOVASCULAR:Not Examined RESPIRATORY: Not Examined BREASTS: Not Examined ABDOMEN:  Soft, non distended; Non tender.  No Organomegaly. PELVIC:  External Genitalia: Normal  BUS: Normal  Vagina: Mild atrophic changes; first to second degree cystocele  Cervix: Normal; no lesions  Uterus: Normal size, shape,consistency, mobile; no prolapse  Adnexa: Normal; nonpalpable and nontender  RV: Normal external exam; no rectal masses; normal sphincter tone  Bladder: Nontender MUSCULOSKELETAL: Normal  range of motion. No tenderness.  No cyanosis, clubbing, or edema.   PROCEDURE: Pessary fitting  #2 ring with support pessary-successful  Assessment:   1. Cystocele, midline, first to second-degree  2. Urinary incontinence, unspecified type  3. Status post bilateral salpingo-oophorectomy (BSO); not a vaginal estrogen candidate due to breast cancer history  4. History of breast cancer     Plan:   1. #2 ring pessary with support-fitted 2. Patient to return in 1-2 weeks for pessary insertion  A total of 30 minutes were spent face-to-face with the patient during the encounter with greater than 50% dealing with counseling and coordination of care.  Brayton Mars, MD  Note: This dictation was prepared with Dragon dictation along with smaller phrase technology. Any transcriptional errors that result from this process are unintentional.

## 2016-03-27 NOTE — Patient Instructions (Addendum)
  1. #2 ring with support pessary is ordered 2. Patient will be contacted for insertion when the pessary arrives

## 2016-04-02 ENCOUNTER — Ambulatory Visit: Payer: Medicare Other | Admitting: Urology

## 2016-04-16 ENCOUNTER — Encounter: Payer: Self-pay | Admitting: Urology

## 2016-04-16 ENCOUNTER — Ambulatory Visit (INDEPENDENT_AMBULATORY_CARE_PROVIDER_SITE_OTHER): Payer: Medicare Other | Admitting: Urology

## 2016-04-16 VITALS — BP 113/66 | HR 89 | Ht 66.0 in | Wt 163.0 lb

## 2016-04-16 DIAGNOSIS — N8111 Cystocele, midline: Secondary | ICD-10-CM

## 2016-04-16 DIAGNOSIS — N3946 Mixed incontinence: Secondary | ICD-10-CM

## 2016-04-16 LAB — BLADDER SCAN AMB NON-IMAGING: SCAN RESULT: 26

## 2016-04-16 MED ORDER — MIRABEGRON ER 50 MG PO TB24: 50.0000 mg | ORAL_TABLET | Freq: Every day | ORAL | 12 refills | Status: AC

## 2016-04-16 NOTE — Progress Notes (Signed)
04/16/2016 12:04 PM   Stephanie Bailey 1940-09-26 [id number]  Referring provider: Ezequiel Kayser, MD [facility] [place], [place] [address]  Chief Complaint  Patient presents with  . Urinary Incontinence    HPI: Patient is a 76 year old African-American female who presents today for 3 week follow-up for urinary incontinence, dysuria and vaginal atrophy after a trial of Myrbetriq.    Background history Patient was referred to [place] by, Dr. Raechel Ache, for urinary incontinence.  Patient states that she has had urinary incontinence for years.  Patient has incontinence with urgency.   She states "I couldn't dare try to guess" on how many trips she makes to the bathroom  She "just stays in the bathroom and sometimes she sits down on the toilet and nothing comes out."   She is experiencing 0 to 4 nocturia episodes nightly.  Her incontinence volume is small.   She is continually changing pads to keep clean.  She is having associated urinary frequency, urgency and dysuria.  She denies intermittency, hesitancy, straining to urinate and weak urinary stream.   She does not have a history of urinary tract infections, STI's or injury to the bladder.   She denies gross hematuria, suprapubic pain, back pain, abdominal pain or flank pain.   She has not had any recent fevers, chills, nausea or vomiting.  She does not have a history of nephrolithiasis, GU surgery or GU trauma.  She is not sexually active.  She is post menopausal.  She admits to constipation.  She is drinking 2 to 4 glasses of water daily.   She is drinking one caffeinated beverages a week.  She is drinking no alcoholic beverages daily.  Her risk factors for incontinence are vaginal delivery, a family history of incontinence, age, caffeine, diabetes, stroke, depression, fecal incontinence, vaginal atrophy.  She is taking ACE inhibitors and diuretics.   She had been she in [place] about 20 years ago and it sounds like she had UDS or  a cystoscopy.  She doesn't remember much more about those visits.  She saw Dr.Daniels about 10 years ago and he recommended surgery, but she thought she was too old for surgery.  At her initial visit, she was referred to gynecology for a pessary fitting and started on Intrarosa for her vaginal atrophy.  She was not able to acquire the Intrarosa.  She is bothered a great deal with an uncomfortable urge to urinate and accidental loss of small amounts of urine.  She is bothered quite a bit with night time urination, waking up at night to urinate and urine loss associated with a strong desire to urinate.  She has an upcoming appointment with gynecology in January.    At her last visit, she is having more urge incontinence symptoms at this time.  Her PVR was 79 mL.  She has been drinking two and one half bottles of water daily.  She was given Myrbetriq 50 mg daily samples.  Today, she states that the medication has "slowed her up."   She is only having to rush to the bathroom three times daily, she is having frequency x 4 and she is having nocturia x 0-1.  She denies gross hematuria, suprapubic pain and dysuria.  She has not had recent fevers, chills, nausea or vomiting.  Her PVR today is 26 mL.    She has been fitting for a pessary and found it effective.  Her personal pessary is currently on order.  PMH: Past Medical History:  Diagnosis Date  . Anxiety   . Atrial fibrillation ([place])   . Atrial flutter ([place])   . Breast cancer ([place])   . Breast cancer, left breast ([place]) 03/03/2015  . CHF (congestive heart failure) ([place])   . Depression   . Diabetes ([place])   . HTN (hypertension)   . Kidney failure   . Overactive bladder   . Pacemaker april 2009    Surgical History: Past Surgical History:  Procedure Laterality Date  . DILATION AND CURETTAGE OF UTERUS    . HERNIA REPAIR    . LAPAROSCOPIC OOPHERECTOMY    . MASTECTOMY Left april 2008  . MASTECTOMY Right 1999    Home Medications:  Allergies as  of 04/16/2016      Reactions   Heparin       Medication List       Accurate as of 04/16/16 12:04 PM. Always use your most recent med list.          Acetaminophen 500 MG coapsule Take by mouth.   allopurinol 300 MG tablet Commonly known as:  ZYLOPRIM Take 300 mg by mouth daily.   BD INSULIN SYRINGE ULTRAFINE 31G X 5/16" 0.3 ML Misc Generic drug:  Insulin Syringe-Needle U-100 AS DIRECTED (4 TIMES DAILY WITH LANTUS AND HUMULIN R INSULIN).   benzonatate 100 MG capsule Commonly known as:  TESSALON Take by mouth.   brimonidine 0.2 % ophthalmic solution Commonly known as:  ALPHAGAN Place 1 drop into both eyes 2 (two) times daily.   colchicine 0.6 MG tablet Take 0.6 mg by mouth daily.   digoxin 0.125 MG tablet Commonly known as:  LANOXIN Take 0.125 mg by mouth every Monday, Wednesday, and Friday.   docusate sodium 100 MG capsule Commonly known as:  COLACE Take 100 mg by mouth 2 (two) times daily. 1 cap in the morning; 2 caps in the evening   ferrous sulfate 325 (65 FE) MG tablet Take 325 mg by mouth daily with breakfast.   hydrALAZINE 50 MG tablet Commonly known as:  APRESOLINE Take by mouth.   insulin glargine 100 UNIT/ML injection Commonly known as:  LANTUS Inject 8 Units into the skin at bedtime.   insulin regular 250 units/2.64mL (100 units/mL) injection Commonly known as:  NOVOLIN R,HUMULIN R Inject into the skin 3 (three) times daily before meals. 4 units before breakfast, 2 units before lunch, 4 units before dinner   NOVOLIN R 250 units/2.61mL (100 units/mL) injection Generic drug:  insulin regular INJECT 2-4 UNITS SUB-Q 3X A DAY BEFORE MEALS (4U WITH BREAKFAST, 2U WITH LUNCH, 4U WITH DINNER)   isosorbide mononitrate 30 MG 24 hr tablet Commonly known as:  IMDUR Take 30 mg by mouth daily.   latanoprost 0.005 % ophthalmic solution Commonly known as:  XALATAN Place 1 drop into both eyes at bedtime.   lisinopril 40 MG tablet Commonly known as:   PRINIVIL,ZESTRIL Take 40 mg by mouth daily.   metoprolol 100 MG tablet Commonly known as:  LOPRESSOR Take 100 mg by mouth 2 (two) times daily. 1 tab in the morning, 0.5 tab in the evening.   mirabegron ER 50 MG Tb24 tablet Commonly known as:  MYRBETRIQ Take 1 tablet (50 mg total) by mouth daily.   multivitamin tablet Take 1 tablet by mouth daily.   omega-3 acid ethyl esters 1 g capsule Commonly known as:  LOVAZA Take 1 g by mouth daily.   Prasterone 6.5 MG Inst Commonly known as:  [employer]  1 suppository vaginally daily.   risperiDONE 1 MG tablet Commonly known as:  RISPERDAL Take 1 mg by mouth daily.   simvastatin 40 MG tablet Commonly known as:  ZOCOR Take 40 mg by mouth daily at 6 PM.   spironolactone 25 MG tablet Commonly known as:  ALDACTONE Take 25 mg by mouth daily.   torsemide 20 MG tablet Commonly known as:  DEMADEX 3 pills twice daily  But 3 pills twice daily for weight 165 lbs or more 3 pills twice for weight 158 lbs or les   warfarin 5 MG tablet Commonly known as:  COUMADIN Take 5 mg by mouth daily. 1.5 tabs on Monday; 1 tab every other day of the week.       Allergies:  Allergies  Allergen Reactions  . Heparin     Family History: Family History  Problem Relation Age of Onset  . Prostate cancer Brother   . Diabetes Brother   . Breast cancer Sister   . Diabetes Sister   . Kidney disease Neg Hx   . Bladder Cancer Neg Hx   . Ovarian cancer Neg Hx   . Colon cancer Neg Hx     Social History:  reports that she has never smoked. She has never used smokeless tobacco. She reports that she does not drink alcohol or use drugs.  ROS: UROLOGY Frequent Urination?: Yes Hard to postpone urination?: No Burning/pain with urination?: No Get up at night to urinate?: Yes Leakage of urine?: No Urine stream starts and stops?: No Trouble starting stream?: No Do you have to strain to urinate?: No Blood in urine?: No Urinary tract infection?:  No Sexually transmitted disease?: No Injury to kidneys or bladder?: No Painful intercourse?: No Weak stream?: No Currently pregnant?: No Vaginal bleeding?: No Last menstrual period?: n  Gastrointestinal Nausea?: No Vomiting?: No Indigestion/heartburn?: No Diarrhea?: No Constipation?: No  Constitutional Fever: No Night sweats?: No Weight loss?: No Fatigue?: No  Skin Skin rash/lesions?: No Itching?: No  Eyes Blurred vision?: No Double vision?: No  Ears/Nose/Throat Sore throat?: No Sinus problems?: No  Hematologic/Lymphatic Swollen glands?: No Easy bruising?: No  Cardiovascular Leg swelling?: No Chest pain?: No  Respiratory Cough?: No Shortness of breath?: No  Endocrine Excessive thirst?: No  Musculoskeletal Back pain?: No Joint pain?: No  Neurological Headaches?: No Dizziness?: No  Psychologic Depression?: No Anxiety?: No  Physical Exam: BP 113/66 (BP Location: Left Arm, Patient Position: Sitting, Cuff Size: Normal)   Pulse 89   Ht 5\' 6"  (1.676 m)   Wt 163 lb (73.9 kg)   BMI 26.31 kg/m   Constitutional: Well nourished. Alert and oriented, No acute distress. HEENT: [place] AT, moist mucus membranes. Trachea midline, no masses. Cardiovascular: No clubbing, cyanosis, or edema. Respiratory: Normal respiratory effort, no increased work of breathing. GI: Abdomen is soft, non tender, non distended, no abdominal masses. Liver and spleen not palpable.  No hernias appreciated.  Stool sample for occult testing is not indicated.   GU: No CVA tenderness.  No bladder fullness or masses.   Skin: No rashes, bruises or suspicious lesions. Lymph: No cervical or inguinal adenopathy. Neurologic: Grossly intact, no focal deficits, moving all 4 extremities. Psychiatric: Normal mood and affect.  Laboratory Data: Lab Results  Component Value Date   WBC 13.2 (H) 04/15/2015   HGB 11.5 (L) 04/15/2015   HCT 35.8 04/15/2015   MCV 89.3 04/15/2015   PLT 195  04/15/2015    Lab Results  Component Value Date   CREATININE  2.07 (H) 04/15/2015    Lab Results  Component Value Date   AST 19 04/15/2015   Lab Results  Component Value Date   ALT 13 (L) 04/15/2015    Pertinent Imaging: Results for DANALI, GIGGEY (MRN [id number]) as of 04/16/2016 21:52  Ref. Range 04/16/2016 11:30  Scan Result Unknown 26    Assessment & Plan:    1. Mixed Incontinence  - offered behavioral therapies; bladder training, bladder control strategies, pelvic floor muscle training and fluid management- patient felt she was not a good candidate due to her age  - explained that she may have incontinence due to incomplete bladder emptying  - pessary is fitting; personal pessary on order  - found Myrbetriq 50 mg daily effective; prescription given  - RTC in 3 months for PVR and symptoms  - BLADDER SCAN AMB NON-IMAGING  3. Cystocele   - see above   Return in about 3 months (around 07/15/2016) for PVR and OAB questionnaire.  These notes generated with voice recognition software. I apologize for typographical errors.  Stephanie Bailey, [facility] [address], [place], [place] [address] [phone]

## 2016-04-24 ENCOUNTER — Encounter: Payer: Medicare Other | Admitting: Obstetrics and Gynecology

## 2016-07-13 NOTE — Progress Notes (Deleted)
07/15/2016 10:59 PM   RONALD VINSANT [birth year] [id number]  Referring provider: Ezequiel Kayser, MD [facility] [place], [place] [address]  No chief complaint on file.   HPI: Patient is a 76 year old African-American female who presents today for 3 month follow-up for urinary incontinence, cystocele and vaginal atrophy.    Mixed incontinence The patient has been experiencing urgency x *** (***), frequency x *** (***), not/is restricting fluids to avoid visits to the restroom ***, not/is engaging in toilet mapping, incontinence x *** (***) and nocturia x *** (***).   She is currently taking Myrbetriq 50 mg daily and has a pessary in place.  Risk factors for incontinence consist of menopausal state, constipation,  vaginal delivery, a family history of incontinence, age, caffeine, diabetes, stroke, depression, fecal incontinence, vaginal atrophy.  She is taking ACE inhibitors and diuretics.  At her last visit, she is having more urge incontinence symptoms at this time.  Her PVR was 79 mL.  She has been drinking two and one half bottles of water daily.  She was given Myrbetriq 50 mg daily samples.  Today, she states that the medication has "slowed her up."   She is only having to rush to the bathroom three times daily, she is having frequency x 4 and she is having nocturia x 0-1.  She denies gross hematuria, suprapubic pain and dysuria.  She has not had recent fevers, chills, nausea or vomiting.  Her PVR today is 26 mL.    Cystocele She has been fitting for a pessary and found it effective.    Vaginal atrophy ***  PMH: Past Medical History:  Diagnosis Date  . Anxiety   . Atrial fibrillation ([place])   . Atrial flutter ([place])   . Breast cancer ([place])   . Breast cancer, left breast ([place]) 03/03/2015  . CHF (congestive heart failure) ([place])   . Depression   . Diabetes ([place])   . HTN (hypertension)   . Kidney failure   . Overactive bladder   . Pacemaker april 2009     Surgical History: Past Surgical History:  Procedure Laterality Date  . DILATION AND CURETTAGE OF UTERUS    . HERNIA REPAIR    . LAPAROSCOPIC OOPHERECTOMY    . MASTECTOMY Left april 2008  . MASTECTOMY Right 1999    Home Medications:  Allergies as of 07/15/2016      Reactions   Heparin       Medication List       Accurate as of 07/13/16 10:59 PM. Always use your most recent med list.          Acetaminophen 500 MG coapsule Take by mouth.   allopurinol 300 MG tablet Commonly known as:  ZYLOPRIM Take 300 mg by mouth daily.   BD INSULIN SYRINGE ULTRAFINE 31G X 5/16" 0.3 ML Misc Generic drug:  Insulin Syringe-Needle U-100 AS DIRECTED (4 TIMES DAILY WITH LANTUS AND HUMULIN R INSULIN).   benzonatate 100 MG capsule Commonly known as:  TESSALON Take by mouth.   brimonidine 0.2 % ophthalmic solution Commonly known as:  ALPHAGAN Place 1 drop into both eyes 2 (two) times daily.   colchicine 0.6 MG tablet Take 0.6 mg by mouth daily.   digoxin 0.125 MG tablet Commonly known as:  LANOXIN Take 0.125 mg by mouth every Monday, Wednesday, and Friday.   docusate sodium 100 MG capsule Commonly known as:  COLACE Take 100 mg by mouth 2 (two) times daily. 1 cap in  the morning; 2 caps in the evening   ferrous sulfate 325 (65 FE) MG tablet Take 325 mg by mouth daily with breakfast.   hydrALAZINE 50 MG tablet Commonly known as:  APRESOLINE Take by mouth.   insulin glargine 100 UNIT/ML injection Commonly known as:  LANTUS Inject 8 Units into the skin at bedtime.   insulin regular 250 units/2.34mL (100 units/mL) injection Commonly known as:  NOVOLIN R,HUMULIN R Inject into the skin 3 (three) times daily before meals. 4 units before breakfast, 2 units before lunch, 4 units before dinner   NOVOLIN R 250 units/2.75mL (100 units/mL) injection Generic drug:  insulin regular INJECT 2-4 UNITS SUB-Q 3X A DAY BEFORE MEALS (4U WITH BREAKFAST, 2U WITH LUNCH, 4U WITH DINNER)    isosorbide mononitrate 30 MG 24 hr tablet Commonly known as:  IMDUR Take 30 mg by mouth daily.   latanoprost 0.005 % ophthalmic solution Commonly known as:  XALATAN Place 1 drop into both eyes at bedtime.   lisinopril 40 MG tablet Commonly known as:  PRINIVIL,ZESTRIL Take 40 mg by mouth daily.   metoprolol 100 MG tablet Commonly known as:  LOPRESSOR Take 100 mg by mouth 2 (two) times daily. 1 tab in the morning, 0.5 tab in the evening.   mirabegron ER 50 MG Tb24 tablet Commonly known as:  MYRBETRIQ Take 1 tablet (50 mg total) by mouth daily.   multivitamin tablet Take 1 tablet by mouth daily.   omega-3 acid ethyl esters 1 g capsule Commonly known as:  LOVAZA Take 1 g by mouth daily.   Prasterone 6.5 MG Inst Commonly known as:  INTRAROSA Place 1 suppository vaginally daily.   risperiDONE 1 MG tablet Commonly known as:  RISPERDAL Take 1 mg by mouth daily.   simvastatin 40 MG tablet Commonly known as:  ZOCOR Take 40 mg by mouth daily at 6 PM.   spironolactone 25 MG tablet Commonly known as:  ALDACTONE Take 25 mg by mouth daily.   torsemide 20 MG tablet Commonly known as:  DEMADEX 3 pills twice daily  But 3 pills twice daily for weight 165 lbs or more 3 pills twice for weight 158 lbs or les   warfarin 5 MG tablet Commonly known as:  COUMADIN Take 5 mg by mouth daily. 1.5 tabs on Monday; 1 tab every other day of the week.       Allergies:  Allergies  Allergen Reactions  . Heparin     Family History: Family History  Problem Relation Age of Onset  . Prostate cancer Brother   . Diabetes Brother   . Breast cancer Sister   . Diabetes Sister   . Kidney disease Neg Hx   . Bladder Cancer Neg Hx   . Ovarian cancer Neg Hx   . Colon cancer Neg Hx     Social History:  reports that she has never smoked. She has never used smokeless tobacco. She reports that she does not drink alcohol or use drugs.  ROS:                                         Physical Exam: There were no vitals taken for this visit.  Constitutional: Well nourished. Alert and oriented, No acute distress. HEENT: Opp AT, moist mucus membranes. Trachea midline, no masses. Cardiovascular: No clubbing, cyanosis, or edema. Respiratory: Normal respiratory effort, no increased work of breathing. GI: Abdomen  is soft, non tender, non distended, no abdominal masses. Liver and spleen not palpable.  No hernias appreciated.  Stool sample for occult testing is not indicated.   GU: No CVA tenderness.  No bladder fullness or masses.   Skin: No rashes, bruises or suspicious lesions. Lymph: No cervical or inguinal adenopathy. Neurologic: Grossly intact, no focal deficits, moving all 4 extremities. Psychiatric: Normal mood and affect.  Laboratory Data: Lab Results  Component Value Date   WBC 13.2 (H) 04/15/2015   HGB 11.5 (L) 04/15/2015   HCT 35.8 04/15/2015   MCV 89.3 04/15/2015   PLT 195 04/15/2015    Lab Results  Component Value Date   CREATININE 2.07 (H) 04/15/2015    Lab Results  Component Value Date   AST 19 04/15/2015   Lab Results  Component Value Date   ALT 13 (L) 04/15/2015    Pertinent Imaging: ***   Assessment & Plan:    1. Mixed Incontinence  - offered behavioral therapies; bladder training, bladder control strategies, pelvic floor muscle training and fluid management- patient felt she was not a good candidate due to her age  - explained that she may have incontinence due to incomplete bladder emptying  - pessary is fitting; personal pessary on order  - found Myrbetriq 50 mg daily effective; prescription given  - RTC in 3 months for PVR and symptoms  - BLADDER SCAN AMB NON-IMAGING  2. Cystocele   - see above  3. Vaginal atrophy   No Follow-up on file.  These notes generated with voice recognition software. I apologize for typographical errors.  Zara Council, [facility] [address] [place], [place] [address] [phone]

## 2016-07-15 ENCOUNTER — Encounter: Payer: Self-pay | Admitting: Urology

## 2016-07-15 ENCOUNTER — Ambulatory Visit: Payer: Medicare Other | Admitting: Urology

## 2016-12-15 ENCOUNTER — Emergency Department
Admission: EM | Admit: 2016-12-15 | Discharge: 2016-12-23 | Disposition: E | Payer: Medicare Other | Attending: Emergency Medicine | Admitting: Emergency Medicine

## 2016-12-15 DIAGNOSIS — E1122 Type 2 diabetes mellitus with diabetic chronic kidney disease: Secondary | ICD-10-CM | POA: Diagnosis not present

## 2016-12-15 DIAGNOSIS — Z794 Long term (current) use of insulin: Secondary | ICD-10-CM | POA: Insufficient documentation

## 2016-12-15 DIAGNOSIS — E1140 Type 2 diabetes mellitus with diabetic neuropathy, unspecified: Secondary | ICD-10-CM | POA: Diagnosis not present

## 2016-12-15 DIAGNOSIS — Z79899 Other long term (current) drug therapy: Secondary | ICD-10-CM | POA: Insufficient documentation

## 2016-12-15 DIAGNOSIS — I469 Cardiac arrest, cause unspecified: Secondary | ICD-10-CM | POA: Diagnosis present

## 2016-12-15 DIAGNOSIS — Z853 Personal history of malignant neoplasm of breast: Secondary | ICD-10-CM | POA: Insufficient documentation

## 2016-12-15 DIAGNOSIS — N183 Chronic kidney disease, stage 3 (moderate): Secondary | ICD-10-CM | POA: Diagnosis not present

## 2016-12-15 DIAGNOSIS — Z950 Presence of cardiac pacemaker: Secondary | ICD-10-CM | POA: Insufficient documentation

## 2016-12-15 MED ORDER — SODIUM BICARBONATE 8.4 % IV SOLN
INTRAVENOUS | Status: AC | PRN
  Administered 2016-12-15: 50 meq via INTRAVENOUS

## 2016-12-15 MED ORDER — SODIUM CHLORIDE 0.9 % IV SOLN
INTRAVENOUS | Status: AC | PRN
  Administered 2016-12-15: 1000 mL via INTRAVENOUS

## 2016-12-15 MED ORDER — EPINEPHRINE PF 1 MG/10ML IJ SOSY
PREFILLED_SYRINGE | INTRAMUSCULAR | Status: AC | PRN
  Administered 2016-12-15: 1 mg via INTRAVENOUS

## 2016-12-15 MED ORDER — CALCIUM CHLORIDE 10 % IV SOLN
INTRAVENOUS | Status: AC | PRN
  Administered 2016-12-15: 1 g via INTRAVENOUS

## 2016-12-16 MED FILL — Medication: Qty: 1 | Status: AC

## 2016-12-23 NOTE — Progress Notes (Signed)
[place] responded to a PG for a CPR in progress. [place] escorted family to the family waiting room. [place] joined the [employer] as she informed on the Pt status. The EDP asked for the family to be present as they ceased CPR. [place] spent time with the family as they grieved. Seeing they were in a good space I gave my condolences and exited.     01/03/2017 2000  Clinical Encounter Type  Visited With Patient;Patient and family together;Health care provider  Visit Type Initial;Spiritual support;Death;ED  Referral From Nurse  Spiritual Encounters  Spiritual Needs Prayer;Emotional;Grief support

## 2016-12-23 NOTE — Code Documentation (Signed)
Patient time of death occurred at Jul 19, 2004.

## 2016-12-23 NOTE — ED Triage Notes (Addendum)
Pt presents via [facility], CPR in progress. PEA reports per EMS PTA. Pt pulseless upon arrival. CPR continued. 7 rounds epi, 500cc saline bolus and 1 amp bicarb given by EMR PTA. EMS reports pt with estimated downtime of 59min no CPR prior to their arrival.

## 2016-12-23 NOTE — ED Notes (Signed)
Family at bedside. 

## 2016-12-23 NOTE — ED Provider Notes (Addendum)
[facility] Emergency Department Provider Note  ____________________________________________   None    (approximate)  I have reviewed the triage vital signs and the nursing notes.   HISTORY  Chief Complaint Cardiac Arrest  level V exemption history Limited by the patient's clinical condition  HPI Stephanie Bailey is a 76 y.o. female who comes to the emergency department via EMS in [place] a cardiac arrest. According to EMS the patient had somewhere between a 10 and 20 minute downtime between her collapsing EMS arrival. When they arrived on scene she was pulseless and PDA. EMS placed a King LT 2, a right tibial intraosseous line, and in route they gave a total of 6 A of epinephrine as well as 1 amp of sodium bicarbonate in it. She was on the [place] device throughout the duration of the transport which took roughly 20 minutes. Her total downtime by the time she got to the emergency department was over 45 minutes. She never regained pulses in route.   Past Medical History:  Diagnosis Date  . Anxiety   . Atrial fibrillation ([place])   . Atrial flutter ([place])   . Breast cancer ([place])   . Breast cancer, left breast ([place]) 03/03/2015  . CHF (congestive heart failure) ([place])   . Depression   . Diabetes ([place])   . HTN (hypertension)   . Kidney failure   . Overactive bladder   . Pacemaker april 2009    Patient Active Problem List   Diagnosis Date Noted  . History of breast cancer 03/27/2016  . Status post bilateral salpingo-oophorectomy (BSO) 03/27/2016  . Cystocele, midline 03/27/2016  . Acute combined systolic and diastolic heart failure due to valvular disease ([place]) 02/28/2016  . Fitting or adjustment of cardiac pacemaker 12/19/2015  . Malignant neoplasm of left female breast ([place]) 03/03/2015  . Hyperparathyroidism due to renal insufficiency ([place]) 12/09/2014  . Chronic anticoagulation 06/08/2014  . Urinary incontinence 06/08/2014  . Anemia 12/08/2013  .  Cardiomyopathy ([place]) 12/08/2013  . CKD (chronic kidney disease), stage III 12/08/2013  . Glaucoma 12/08/2013  . Hypercholesterolemia 12/08/2013  . Proteinuria due to type 2 diabetes mellitus ([place]) 12/08/2013  . Type 2 diabetes mellitus, controlled, with renal complications ([place]) 33/29/5188  . Type 2 diabetes, controlled, with neuropathy ([place]) 12/08/2013  . Gout 02/15/2013  . Schizophrenia ([place]) 06/11/2012  . History of prior ablation treatment 10/01/2011  . Heart failure, chronic systolic ([place]) 41/66/0630  . Essential hypertension 12/15/2010  . Valvular heart disease 12/15/2010  . Biventricular cardiac pacemaker in situ 07/13/2007  . Chronic atrial fibrillation ([place]) 12/15/2006    Past Surgical History:  Procedure Laterality Date  . DILATION AND CURETTAGE OF UTERUS    . HERNIA REPAIR    . LAPAROSCOPIC OOPHERECTOMY    . MASTECTOMY Left april 2008  . MASTECTOMY Right 1999    Prior to Admission medications   Medication Sig Start Date End Date Taking? Authorizing Provider  Acetaminophen 500 MG coapsule Take by mouth.    [provider]  allopurinol (ZYLOPRIM) 300 MG tablet Take 300 mg by mouth daily.    [provider]  benzonatate (TESSALON) 100 MG capsule Take by mouth. 06/09/15   [provider]  brimonidine (ALPHAGAN) 0.2 % ophthalmic solution Place 1 drop into both eyes 2 (two) times daily.    [provider]  colchicine 0.6 MG tablet Take 0.6 mg by mouth daily.    [provider]  digoxin (LANOXIN) 0.125 MG tablet Take 0.125 mg  by mouth every Monday, Wednesday, and Friday.    [provider]  docusate sodium (COLACE) 100 MG capsule Take 100 mg by mouth 2 (two) times daily. 1 cap in the morning; 2 caps in the evening    [provider]  ferrous sulfate 325 (65 FE) MG tablet Take 325 mg by mouth daily with breakfast.    [provider]  hydrALAZINE (APRESOLINE) 50 MG tablet Take by mouth. 12/15/15   [provider]  insulin glargine (LANTUS) 100 UNIT/ML injection Inject 8 Units into the skin at bedtime.     [provider]  insulin regular (NOVOLIN R) 100 units/mL injection INJECT 2-4 UNITS SUB-Q 3X A DAY BEFORE MEALS (4U WITH BREAKFAST, 2U WITH LUNCH, 4U WITH DINNER) 12/11/15   [provider]  insulin regular (NOVOLIN R,HUMULIN R) 100 units/mL injection Inject into the skin 3 (three) times daily before meals. 4 units before breakfast, 2 units before lunch, 4 units before dinner    [provider]  Insulin Syringe-Needle U-100 (BD INSULIN SYRINGE ULTRAFINE) 31G X 5/16" 0.3 ML MISC AS DIRECTED (4 TIMES DAILY WITH LANTUS AND HUMULIN R INSULIN). 02/19/16   [provider]  isosorbide mononitrate (IMDUR) 30 MG 24 hr tablet Take 30 mg by mouth daily.    [provider]  latanoprost (XALATAN) 0.005 % ophthalmic solution Place 1 drop into both eyes at bedtime.    [provider]  lisinopril (PRINIVIL,ZESTRIL) 40 MG tablet Take 40 mg by mouth daily.    [provider]  metoprolol (LOPRESSOR) 100 MG tablet Take 100 mg by mouth 2 (two) times daily. 1 tab in the morning, 0.5 tab in the evening.    [provider]  mirabegron ER (MYRBETRIQ) 50 MG TB24 tablet Take 1 tablet (50 mg total) by mouth daily. 04/16/16   Zara Council A, PA-C  Multiple Vitamin (MULTIVITAMIN) tablet Take 1 tablet by mouth daily.    [provider]  omega-3 acid ethyl esters (LOVAZA) 1 G capsule Take 1 g by mouth daily.    [provider]  Prasterone (INTRAROSA) 6.5 MG INST Place 1 suppository vaginally daily. 12/12/15   Zara Council A, PA-C  risperiDONE (RISPERDAL) 1 MG tablet Take 1 mg by mouth daily.    [provider]  simvastatin (ZOCOR) 40 MG tablet Take 40 mg by mouth daily at 6 PM.    [provider]  spironolactone (ALDACTONE) 25 MG tablet Take 25 mg by mouth daily.    [provider]  torsemide (DEMADEX)  20 MG tablet 3 pills twice daily  But 3 pills twice daily for weight 165 lbs or more 3 pills twice for weight 158 lbs or les 03/11/16   [provider]  warfarin (COUMADIN) 5 MG tablet Take 5 mg by mouth daily. 1.5 tabs on Monday; 1 tab every other day of the week.    [provider]    Allergies Heparin  Family History  Problem Relation Age of Onset  . Prostate cancer Brother   . Diabetes Brother   . Breast cancer Sister   . Diabetes Sister   . Kidney disease Neg Hx   . Bladder Cancer Neg Hx   . Ovarian cancer Neg Hx   . Colon cancer Neg Hx     Social History Social History  Substance Use Topics  . Smoking status: Never Smoker  . Smokeless tobacco: Never Used  . Alcohol use No    Review of Systems level  V exemption history Limited by the patient's clinical condition  ____________________________________________   PHYSICAL EXAM:  VITAL SIGNS: ED Triage Vitals  Enc Vitals Group     BP      Pulse      Resp      Temp      Temp src      SpO2      Weight      Height      Head Circumference      Peak Flow      Pain Score      Pain Loc      Pain Edu?      Excl. in [place]?     Constitutional: appears ashen not breathing King LT tube in place with active CPR in process Eyes: pupils fixed and dilated Head: Atraumatic. Nose: No congestion/rhinnorhea. Mouth/Throat: King [place] Neck: trachea midline Cardiovascular: strong pulses with CPR Respiratory: bilateral breath sounds moving good air course throughout some blood-tinged sputum in the tube Gastrointestinal: distended abdomen Musculoskeletal: No lower extremity edema   Neurologic:  comatose Skin:  Skin is warm, dry and intact. No rash noted.     ____________________________________________   DIFFERENTIAL includes but not limited to  myocardial infarction, pulmonary embolism, acidosis, hypoxia, malignant arrhythmia ____________________________________________   LABS (all labs  ordered are listed, but only abnormal results are displayed)  Labs Reviewed - No data to display  labs reviewed by me EMS noted a blood sugar of 161 en route __________________________________________  EKG   ____________________________________________  RADIOLOGY   ____________________________________________   PROCEDURES  Procedure(s) performed: yes  IO PLACEMENT in usual sterile conditions I placed a left tibial intraosseous line without complication. It drew back [place] flushed   Cardiopulmonary Resuscitation (CPR) Procedure Note Directed/Performed by: Darel Hong I personally directed ancillary staff and/or performed CPR in an effort to regain return of spontaneous circulation and to maintain cardiac, neuro and systemic perfusion.  Please see nursing notes for exact timing  Procedures  Critical Care performed: no  Observation: no ____________________________________________   INITIAL IMPRESSION / ASSESSMENT AND PLAN / ED COURSE  Pertinent labs & imaging results that were available during my care of the patient were reviewed by me and considered in my medical decision making (see chart for details).  the patient arrived in neuro complex pulseless electrical activity after more than 45 minutes of down time. We gave her 1 amp of epinephrine, one of sodium bicarbonate, and 1 of calcium chloride and continued CPR. She had clear breath sounds bilaterally as well as saturating 100% on the King LT. After 3 rounds of CPR and performed a bedside ultrasound which showed cardiac standstill. We then had the family and to the room and witnessed one more round of CPR and at that point the family and myself agreed to terminate resuscitation. The patient's time of death was 8:06 PM.     ----------------------------------------- 8:51 PM on Dec 25, 2016 -----------------------------------------  I called the patient's primary care physician Dr. Raechel Ache to notify him of the  patient's death. ____________________________________________   FINAL CLINICAL IMPRESSION(S) / ED DIAGNOSES  Final diagnoses:  Cardiac arrest [facility])      NEW MEDICATIONS STARTED DURING THIS VISIT:  New Prescriptions   No medications on file     Note:  This document was prepared using Dragon voice recognition software and may include unintentional dictation errors.     Darel Hong, MD 12-25-16 2028    Darel Hong, MD 2016/12/25 2051

## 2016-12-23 DEATH — deceased

## 2017-01-16 IMAGING — CT CT ABD-PELV W/O CM
1 of 2 series · 14 of 32 positions shown, 18 images · non-contrast
Comparison: Prior study from 09/09/2012.

CLINICAL DATA: Initial valuation for acute left lower quadrant
pain.

EXAM:
CT ABDOMEN AND PELVIS WITHOUT CONTRAST
TECHNIQUE: Multidetector CT imaging of the abdomen and pelvis was performed
following the standard protocol without IV contrast.

[Series 2: routine abd pel without · axial · non-contrast · 0.72mm/px · z∈[-122,+268]mm · 14 of 90 slices shown, 18 images]
[im 8/90  soft-tissue]
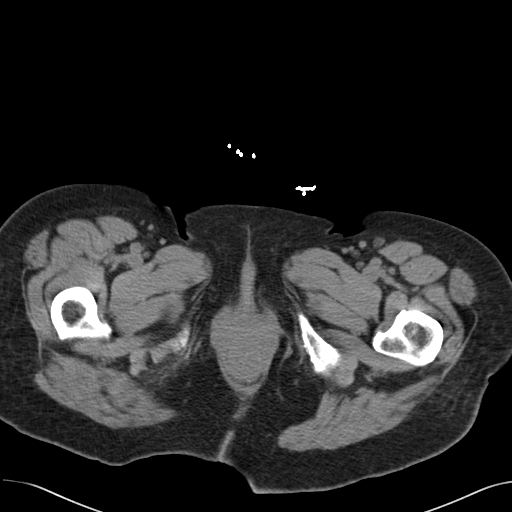
[im 8/90  bone]
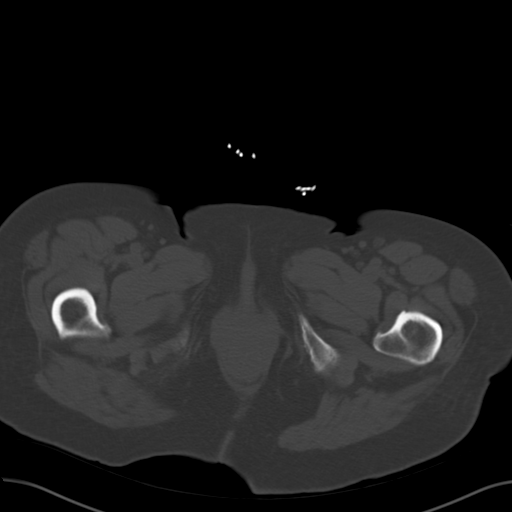
[im 15/90  soft-tissue]
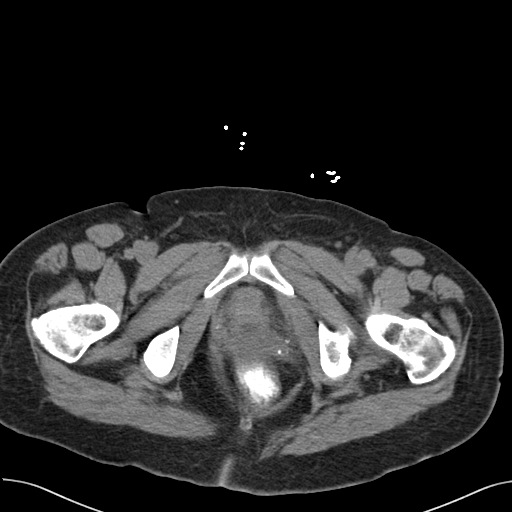
[im 22/90  soft-tissue]
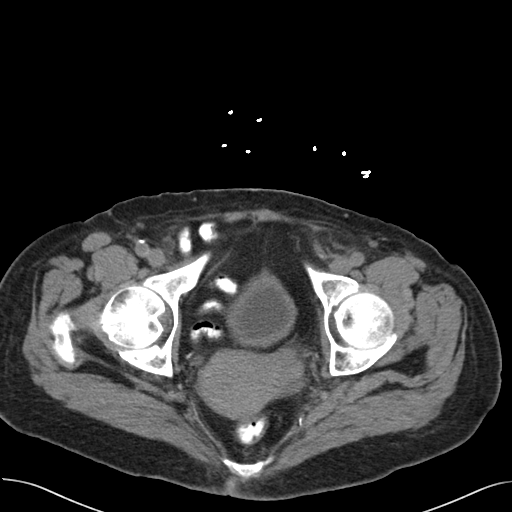
[im 29/90  soft-tissue]
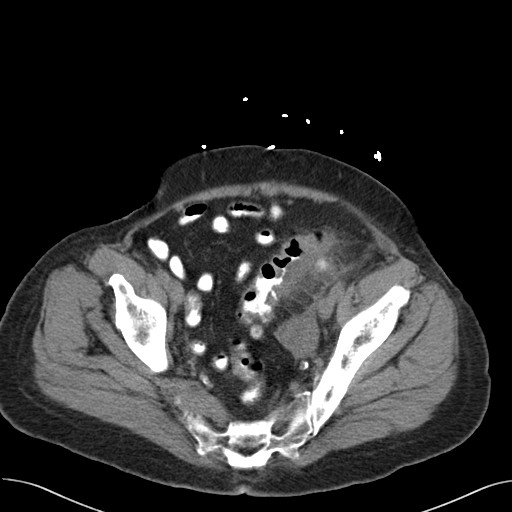
[im 36/90  soft-tissue]
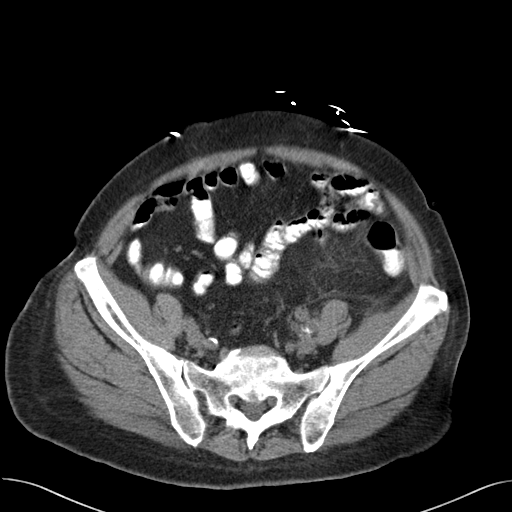
[im 43/90  soft-tissue]
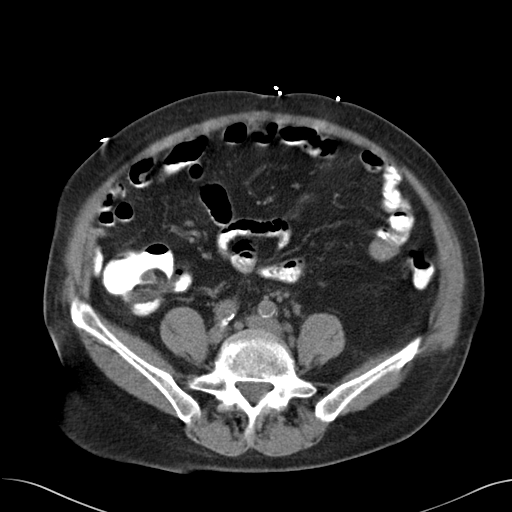
[im 50/90  soft-tissue]
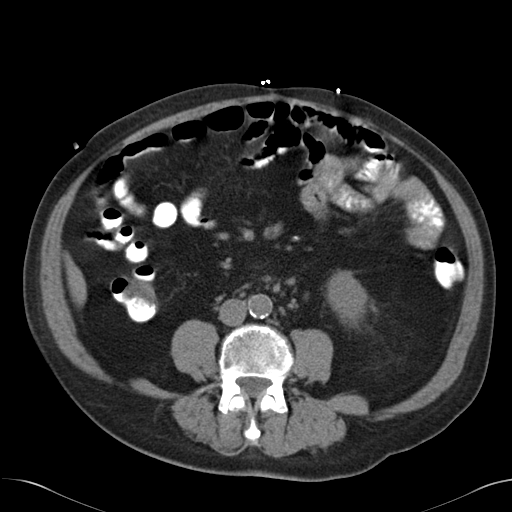
[im 57/90  soft-tissue]
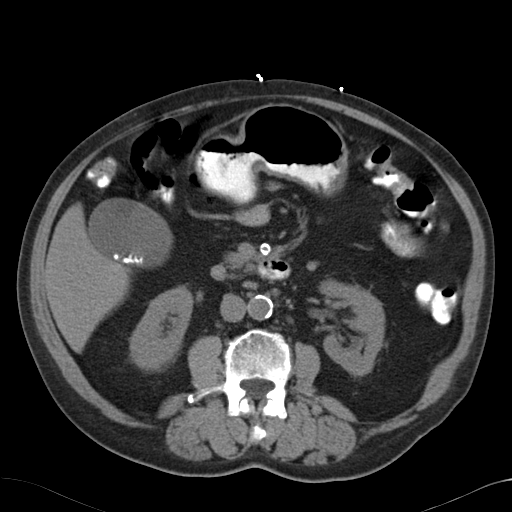
[im 65/90  soft-tissue]
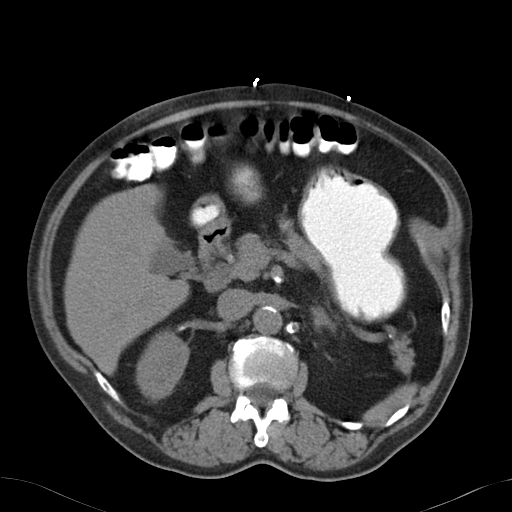
[im 65/90  bone]
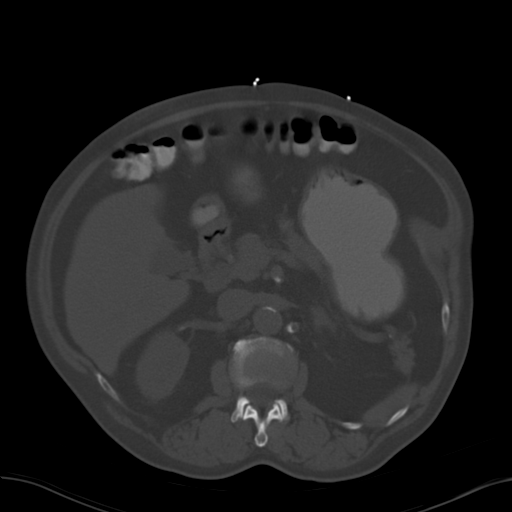
[im 72/90  soft-tissue]
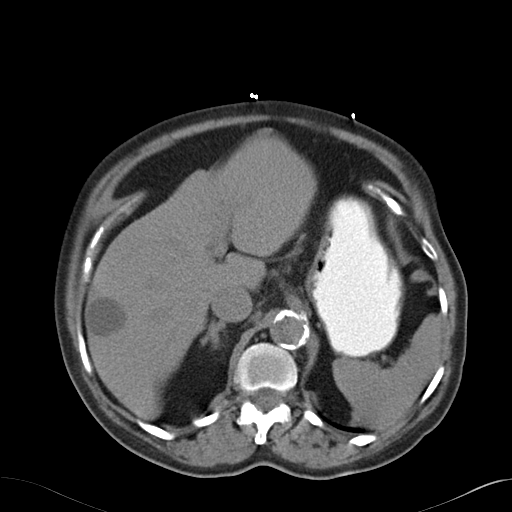
[im 75/90  lung]
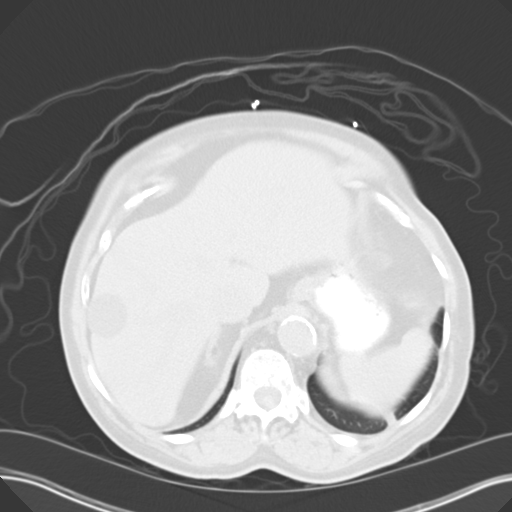
[im 79/90  soft-tissue]
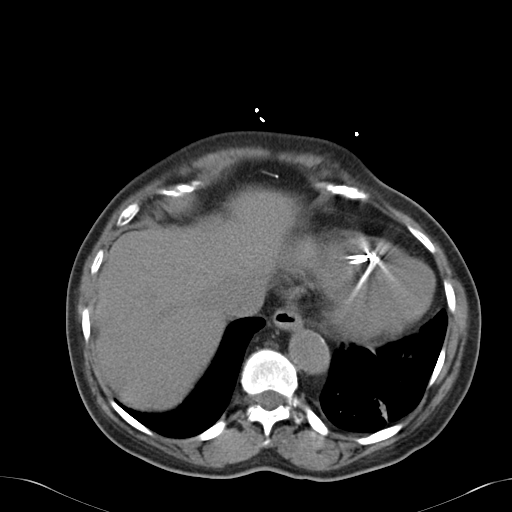
[im 79/90  lung]
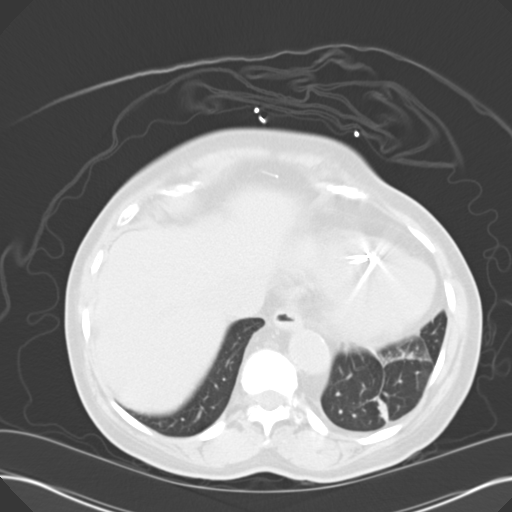
[im 82/90  lung]
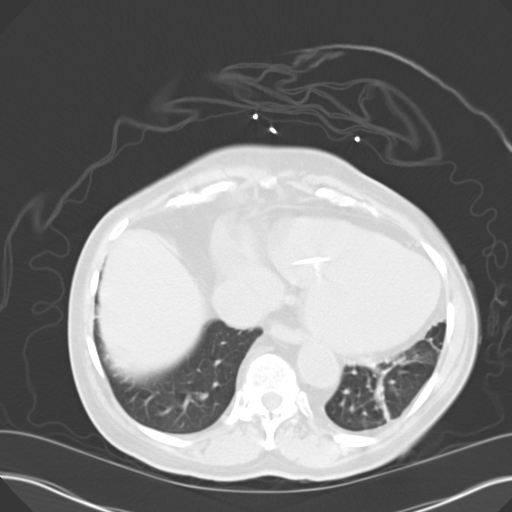
[im 86/90  soft-tissue]
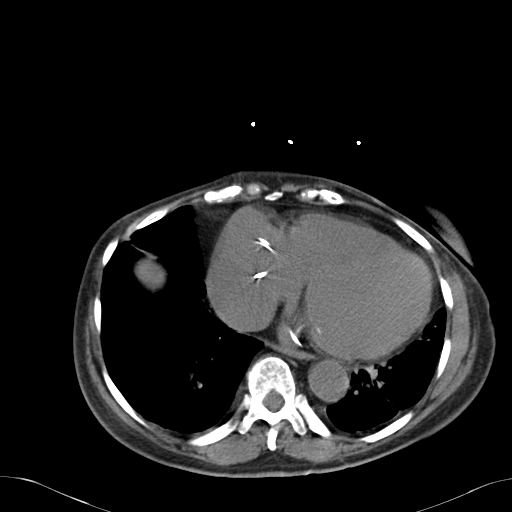
[im 86/90  lung]
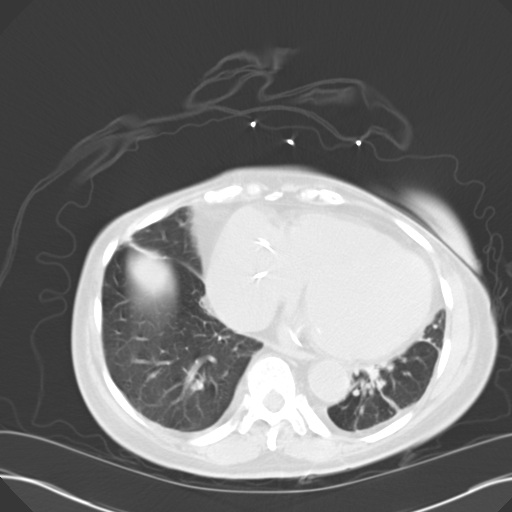

[14 of 32 positions shown; findings below may reference images not displayed]

FINDINGS: Scattered coarse linear opacities within the visualized lung bases
most consistent with atelectasis and/ or scarring. Visualized lung
bases are otherwise clear. Cardiomegaly noted. Pacemaker electrodes
partially visualized. No pleural or pericardial effusion. Bilateral
breast implants partially visualized.

3 cm cyst present within the subcapsular right hepatic lobe. Limited
noncontrast evaluation of the liver is otherwise unremarkable.
Multiple layering calcified stones present within the gallbladder
lumen. No CT evidence for acute cholecystitis. No biliary
dilatation. Spleen within normal limits. Mild thickening of the
adrenal glands bilaterally without focal lesion. Pancreas
demonstrates a normal unenhanced appearance.

Kidneys are equal in size without evidence of nephrolithiasis or
hydronephrosis. Faint 4 mm hyperdensity at the inferior pole the
left kidney noted, indeterminate, but may reflect a small
proteinaceous or hemorrhagic cyst.

Stomach within normal limits. No evidence for bowel obstruction. No
evidence for acute appendicitis. Wall thickening with inflammatory
stranding present about several colonic diverticula in the sigmoid
colon, consistent with acute diverticulitis. No free air to suggest
perforation. No diverticular abscess on this noncontrast
examination. No evidence for associated obstruction, as enteric
contrast material reaches the level the rectum.

Mild circumferential bladder wall thickening likely related
incomplete distension. Bladder are otherwise unremarkable. Few
scattered calcifications within the uterus may reflect small
fibroids. Left ovary normal. Right ovary not visualized.

Fat containing left inguinal hernia noted. No free fluid. No
pathologically enlarged intra-abdominal or pelvic lymph nodes
identified. Mildly prominent left external iliac nodes measuring up
to 6 mm likely reactive in nature. Moderate to advanced
atherosclerotic disease throughout the intra-abdominal aorta and its
branch vessels. No aneurysm.

No acute osseous abnormality. No worrisome lytic or blastic osseous
lesions.
IMPRESSION: 1. Findings consistent with acute sigmoid diverticulitis. No
evidence for perforation or other complication.
2. Cholelithiasis.
3. Cardiomegaly with moderate to advanced atheromatous disease
throughout the intra-abdominal aorta and its branch vessels. No
aneurysm.

## 2017-03-27 ENCOUNTER — Ambulatory Visit: Payer: Medicare Other | Admitting: Oncology
